# Patient Record
Sex: Male | Born: 1955 | Race: White | Hispanic: No | Marital: Married
Health system: Midwestern US, Academic
[De-identification: ages and names within clinical notes are randomized; demographics above are authoritative.]

---

## 2016-12-08 MED ORDER — METOPROLOL SUCCINATE 100 MG PO TB24
ORAL_TABLET | Freq: Every day | ORAL | 3 refills | 90.00000 days | Status: DC
Start: 2016-12-08 — End: 2017-11-30

## 2016-12-08 MED ORDER — ALPRAZOLAM 0.5 MG PO TAB
ORAL_TABLET | Freq: Three times a day (TID) | 5 refills | Status: DC | PRN
Start: 2016-12-08 — End: 2017-06-29

## 2017-01-18 ENCOUNTER — Encounter: Admit: 2017-01-18 | Discharge: 2017-01-18 | Payer: BC Managed Care – HMO

## 2017-01-18 MED ORDER — PRAVASTATIN 40 MG PO TAB
40 mg | ORAL_TABLET | Freq: Every evening | ORAL | 3 refills | 90.00000 days | Status: AC
Start: 2017-01-18 — End: 2017-11-30

## 2017-01-22 ENCOUNTER — Encounter: Admit: 2017-01-22 | Discharge: 2017-01-22 | Payer: BC Managed Care – HMO

## 2017-01-22 DIAGNOSIS — I513 Intracardiac thrombosis, not elsewhere classified: ICD-10-CM

## 2017-01-22 DIAGNOSIS — I4819 Other persistent atrial fibrillation: ICD-10-CM

## 2017-01-22 DIAGNOSIS — E7800 Pure hypercholesterolemia, unspecified: Principal | ICD-10-CM

## 2017-01-22 DIAGNOSIS — I10 Essential (primary) hypertension: ICD-10-CM

## 2017-01-22 DIAGNOSIS — G4734 Idiopathic sleep related nonobstructive alveolar hypoventilation: ICD-10-CM

## 2017-01-22 DIAGNOSIS — J449 Chronic obstructive pulmonary disease, unspecified: ICD-10-CM

## 2017-01-22 DIAGNOSIS — I5022 Chronic systolic (congestive) heart failure: ICD-10-CM

## 2017-01-22 DIAGNOSIS — K219 Gastro-esophageal reflux disease without esophagitis: ICD-10-CM

## 2017-01-22 MED ORDER — DOFETILIDE 500 MCG PO CAP
ORAL_CAPSULE | Freq: Two times a day (BID) | 3 refills | Status: AC
Start: 2017-01-22 — End: 2017-11-30

## 2017-04-08 ENCOUNTER — Encounter: Admit: 2017-04-08 | Discharge: 2017-04-08 | Payer: BC Managed Care – HMO

## 2017-04-22 ENCOUNTER — Encounter: Admit: 2017-04-22 | Discharge: 2017-04-22 | Payer: BC Managed Care – HMO

## 2017-04-22 ENCOUNTER — Ambulatory Visit: Admit: 2017-04-22 | Discharge: 2017-04-23 | Payer: BC Managed Care – HMO

## 2017-04-22 DIAGNOSIS — I426 Alcoholic cardiomyopathy: ICD-10-CM

## 2017-04-22 DIAGNOSIS — I5022 Chronic systolic (congestive) heart failure: ICD-10-CM

## 2017-04-22 DIAGNOSIS — E7800 Pure hypercholesterolemia, unspecified: ICD-10-CM

## 2017-04-22 DIAGNOSIS — I10 Essential (primary) hypertension: Principal | ICD-10-CM

## 2017-04-22 DIAGNOSIS — A0472 Enterocolitis due to Clostridium difficile, not specified as recurrent: ICD-10-CM

## 2017-04-22 DIAGNOSIS — M549 Dorsalgia, unspecified: ICD-10-CM

## 2017-04-22 DIAGNOSIS — F419 Anxiety disorder, unspecified: ICD-10-CM

## 2017-04-22 DIAGNOSIS — I4891 Unspecified atrial fibrillation: ICD-10-CM

## 2017-04-22 DIAGNOSIS — J449 Chronic obstructive pulmonary disease, unspecified: ICD-10-CM

## 2017-04-22 DIAGNOSIS — R198 Other specified symptoms and signs involving the digestive system and abdomen: ICD-10-CM

## 2017-04-22 DIAGNOSIS — I2510 Atherosclerotic heart disease of native coronary artery without angina pectoris: ICD-10-CM

## 2017-04-22 DIAGNOSIS — I82409 Acute embolism and thrombosis of unspecified deep veins of unspecified lower extremity: ICD-10-CM

## 2017-04-22 DIAGNOSIS — R002 Palpitations: ICD-10-CM

## 2017-04-22 DIAGNOSIS — R9439 Abnormal result of other cardiovascular function study: Principal | ICD-10-CM

## 2017-04-22 DIAGNOSIS — I480 Paroxysmal atrial fibrillation: ICD-10-CM

## 2017-04-22 DIAGNOSIS — E785 Hyperlipidemia, unspecified: ICD-10-CM

## 2017-04-22 DIAGNOSIS — R238 Other skin changes: ICD-10-CM

## 2017-04-22 MED ORDER — SPIRONOLACTONE 25 MG PO TAB
25 mg | ORAL_TABLET | Freq: Every day | ORAL | 3 refills | 90.00000 days | Status: AC
Start: 2017-04-22 — End: 2018-04-08

## 2017-04-22 NOTE — Telephone Encounter
-----   Message from Vanice SarahSteven D Owens, MD sent at 04/22/2017 11:05 AM CDT -----  Can you please call him to ask him to increase his spironolactone to 25 mg/day?  I hadn't noticed his diastolic BP readings while he was here, but we should probably try to pull his diastolic numbers down below 80 if possible.  Thanks!

## 2017-04-22 NOTE — Assessment & Plan Note
He has not had any symptoms suggesting recurrence of atrial arrhythmias.  Today's QT interval looks normal on Tikosyn.

## 2017-04-22 NOTE — Assessment & Plan Note
Lab Results   Component Value Date    CHOL 136 07/24/2016    TRIG 58 07/24/2016    HDL 36 (L) 07/24/2016    LDL 80 07/24/2016    VLDL 12 07/24/2016    NONHDLCHOL 100 07/24/2016    CHOLHDLC 4 11/23/2015      LDL looks good on current medical therapy.

## 2017-04-22 NOTE — Telephone Encounter
lmom requested call back if question

## 2017-04-22 NOTE — Telephone Encounter
04/22/2017 2:52 PM   Pt c/b and confirmed increasing to 25mg  aldactone daily. Pt will start tomorrow.

## 2017-04-22 NOTE — Assessment & Plan Note
He is not abstaining entirely but he has reduced his alcohol consumption significantly.

## 2017-04-22 NOTE — Progress Notes
Date of Service: 04/22/2017    Jonathan Barr is a 61 y.o. male.       Jonathan Barr     Jonathan Barr was in the office today for follow-up regarding atrial fibrillation and history of cardiomyopathy.  He's retired now, but isn't pursuing disability.    He has some dyspnea that he attributes to COPD.  He isn't having palpitations or light headedness.  He denies any anginal chest discomfort.    He drinks a little bit of beer at times but he is substantially decreased his alcohol intake.  When we last assessed his ventricular function in December his ejection fraction was 55%.    He does not think he has had any further problems with atrial fibrillation.  He denies any palpitations and he does check his pulse occasionally and has not noticed any particular irregularities.           Vitals:    04/22/17 1025 04/22/17 1036   BP: 124/82 126/90   Pulse: 61    Weight: 94.8 kg (209 lb)    Height: 1.854 m (6' 1)      Body mass index is 27.57 kg/m???.     Past Medical History  Patient Active Problem List    Diagnosis Date Noted   ??? 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 Umbilical hernia 03/30/2016     Added automatically from request for surgery [id number]     ??? Paroxysmal atrial fibrillation (HCC) 12/20/2015     02/22/01 Holter:  Predominant SR, Rare VPD's with bigeminy.  Rare non-sustained SVT.  Rare APD's    12/20/15 TEE with evidence of LA thrombus. Canceled DCCV. Digoxin loaded.  12/2015 - Successful TEE-cardioversion.  Started on Tikosyn.     ??? Pulmonary emphysema (HCC) 12/18/2015   ??? Nocturnal oxygen desaturation - O2 use 12/18/2015   ??? Colon polyp 02/07/2013     12/2012 - Colonoscopy     ??? GERD (gastroesophageal reflux disease) 02/07/2013     12/2012- EGD negative for Barrett's     ??? Pulmonary nodule 02/07/2013     12/2012 - CT-chest 4.7 mm nodular opacity of peripheral left lower lobe.  F/U CT recommended in 6 months.     ??? Essential hypertension 04/24/2011   ??? History of tobacco use 04/24/2011 ??? Cardiomyopathy, alcoholic (HCC)      EF  48%  per stress echo 02/29/00.    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  Normal study.           ??? Anxiety    ??? Chest pain 09/24/2006        a. Stress Echo 02/29/00 with mild resting global LV asyneresis. Induced inf and post wall myocardial ischemia      b. 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      c.  Cardiac cath 2/08 - Normal coronaries, normal LV function       ??? Hyperlipidemia          Review of Systems   Constitution: Negative.   HENT: Positive for hoarse voice and tinnitus.    Eyes: Negative.    Cardiovascular: Positive for chest pain.   Respiratory: Positive for shortness of breath, sputum production and wheezing.    Endocrine: Negative.    Hematologic/Lymphatic: Negative.    Skin: Positive for poor wound healing.  Musculoskeletal: Negative.    Gastrointestinal: Positive for diarrhea.   Genitourinary: Negative.    Neurological: Positive for headaches.   Psychiatric/Behavioral: Negative.    Allergic/Immunologic: Negative.        Physical Exam    Physical Exam   General Appearance: no distress   Skin: warm, no ulcers or xanthomas   Digits and Nails: no cyanosis or clubbing   Eyes: conjunctivae and lids normal, pupils are equal and round   Teeth/Gums/Palate: dentition unremarkable, no lesions   Lips & Oral Mucosa: no pallor or cyanosis   Neck Veins: normal JVP , neck veins are not distended   Thyroid: no nodules, masses, tenderness or enlargement   Chest Inspection: chest is normal in appearance   Respiratory Effort: breathing comfortably, no respiratory distress   Auscultation/Percussion: lungs clear to auscultation, no rales or rhonchi, no wheezing   PMI: PMI not enlarged or displaced   Cardiac Rhythm: regular rhythm and normal rate   Cardiac Auscultation: S1, S2 normal, no rub, no gallop   Murmurs: no murmur Peripheral Circulation: normal peripheral circulation   Carotid Arteries: normal carotid upstroke bilaterally, no bruits   Radial Arteries: normal symmetric radial pulses   Abdominal Aorta: no abdominal aortic bruit   Pedal Pulses: normal symmetric pedal pulses   Lower Extremity Edema: no lower extremity edema   Abdominal Exam: soft, non-tender, no masses, bowel sounds normal   Liver & Spleen: no organomegaly   Gait & Station: walks without assistance   Muscle Strength: normal muscle tone   Orientation: oriented to time, place and person   Affect & Mood: appropriate and sustained affect   Language and Memory: patient responsive and seems to comprehend information   Neurologic Exam: neurological assessment grossly intact   Other: moves all extremities      Cardiovascular Studies    EKG:  Sinus rhythm, rate 61.  QT 420 msec.    Problems Addressed Today  Encounter Diagnoses   Name Primary?   ??? Essential hypertension    ??? Pure hypercholesterolemia    ??? Paroxysmal atrial fibrillation (HCC)    ??? Cardiomyopathy, alcoholic (HCC)        Assessment and Plan       Essential hypertension  I am going to ask him to go back to taking 25 mg of Spironolactone daily.  Home blood pressure readings tend to be in the 130s systolic and the low 90s diastolic.  My goal would be to pull his diastolic BP down below 80.    Hyperlipidemia  Lab Results   Component Value Date    CHOL 136 07/24/2016    TRIG 58 07/24/2016    HDL 36 (L) 07/24/2016    LDL 80 07/24/2016    VLDL 12 07/24/2016    NONHDLCHOL 100 07/24/2016    CHOLHDLC 4 11/23/2015      LDL looks good on current medical therapy.    Paroxysmal atrial fibrillation (HCC)  He has not had any symptoms suggesting recurrence of atrial arrhythmias.  Today's QT interval looks normal on Tikosyn.    Cardiomyopathy, alcoholic (HCC)  He is not abstaining entirely but he has reduced his alcohol consumption significantly.  The most recent echocardiogram was from last December and it showed that his ventricular function had returned to normal while maintaining sinus rhythm.      Current Medications (including today's revisions)  ??? acetaminophen SR(+) (TYLENOL) 650 mg tablet Take 650 mg by mouth every 6 hours as needed for Pain.   ???  albuterol (VENTOLIN HFA, PROAIR HFA, PROVENTIL HFA) 90 mcg/actuation inhaler Inhale 2 Puffs by mouth into the lungs every 6 hours as needed for Wheezing or Shortness of Breath. Shake well before use.   ??? ALPRAZolam (XANAX) 0.5 mg tablet TAKE ONE TABLET BY MOUTH THREE TIMES DAILY AS NEEDED   ??? aspirin/acetaminophen/caffeine(+) (EXCEDRIN MIGRAINE) 250/250/65 mg tab Take 1 tablet by mouth twice daily as needed.   ??? dofetilide (TIKOSYN) 500 mcg capsule TAKE ONE CAPSULE BY MOUTH TWICE DAILY   ??? ipratropium/albuterol (COMBIVENT RESPIMAT) 20-100 mcg/actuation mist inhaler Inhale 1 Puff by mouth into the lungs four times daily.   ??? L.ACID/L.CASEI/B.BIF/B.LON/FOS (PROBIOTIC BLEND PO) Take 1 tablet by mouth every 48 hours.   ??? Magnesium Oxide 500 mg tab Take 1 tablet by mouth twice daily.   ??? metoprolol XL (TOPROL XL) 100 mg extended release tablet TAKE ONE TABLET BY MOUTH ONCE DAILY   ??? omeprazole DR(+) (PRILOSEC) 20 mg capsule Take 20 mg by mouth daily.   ??? pravastatin (PRAVACHOL) 40 mg tablet Take 1 tablet by mouth at bedtime daily.   ??? rivaroxaban (XARELTO) 20 mg tablet Take 1 Tab by mouth daily with dinner. Take with food.   ??? spironolactone (ALDACTONE) 25 mg tablet Take 0.5 tablets by mouth daily. Take with food.   ??? SYMBICORT 160-4.5 mcg/actuation HFAA inhalation INHALE TWO PUFFS BY MOUTH AS NEEDED (Patient taking differently: INHALE TWO PUFFS BY MOUTH TWO TIMES A DAY)

## 2017-06-28 ENCOUNTER — Encounter: Admit: 2017-06-28 | Discharge: 2017-06-28 | Payer: BC Managed Care – HMO

## 2017-06-28 DIAGNOSIS — R079 Chest pain, unspecified: Principal | ICD-10-CM

## 2017-06-28 DIAGNOSIS — F419 Anxiety disorder, unspecified: ICD-10-CM

## 2017-06-29 MED ORDER — ALPRAZOLAM 0.5 MG PO TAB
ORAL_TABLET | Freq: Three times a day (TID) | 5 refills | Status: AC | PRN
Start: 2017-06-29 — End: 2018-06-07

## 2017-10-21 ENCOUNTER — Encounter: Admit: 2017-10-21 | Discharge: 2017-10-21 | Payer: BC Managed Care – HMO

## 2017-10-21 MED ORDER — XARELTO 20 MG PO TAB
ORAL_TABLET | Freq: Every day | ORAL | 10 refills | 30.00000 days | Status: AC
Start: 2017-10-21 — End: 2018-09-09

## 2017-11-18 ENCOUNTER — Encounter: Admit: 2017-11-18 | Discharge: 2017-11-18 | Payer: BC Managed Care – HMO

## 2017-11-18 DIAGNOSIS — R238 Other skin changes: ICD-10-CM

## 2017-11-18 DIAGNOSIS — F419 Anxiety disorder, unspecified: ICD-10-CM

## 2017-11-18 DIAGNOSIS — M549 Dorsalgia, unspecified: ICD-10-CM

## 2017-11-18 DIAGNOSIS — I82409 Acute embolism and thrombosis of unspecified deep veins of unspecified lower extremity: ICD-10-CM

## 2017-11-18 DIAGNOSIS — I426 Alcoholic cardiomyopathy: ICD-10-CM

## 2017-11-18 DIAGNOSIS — A0472 Enterocolitis due to Clostridium difficile, not specified as recurrent: ICD-10-CM

## 2017-11-18 DIAGNOSIS — J449 Chronic obstructive pulmonary disease, unspecified: ICD-10-CM

## 2017-11-18 DIAGNOSIS — R198 Other specified symptoms and signs involving the digestive system and abdomen: ICD-10-CM

## 2017-11-18 DIAGNOSIS — I2510 Atherosclerotic heart disease of native coronary artery without angina pectoris: ICD-10-CM

## 2017-11-18 DIAGNOSIS — R002 Palpitations: ICD-10-CM

## 2017-11-18 DIAGNOSIS — E785 Hyperlipidemia, unspecified: ICD-10-CM

## 2017-11-18 DIAGNOSIS — I5022 Chronic systolic (congestive) heart failure: ICD-10-CM

## 2017-11-18 DIAGNOSIS — R9439 Abnormal result of other cardiovascular function study: Principal | ICD-10-CM

## 2017-11-18 DIAGNOSIS — I4891 Unspecified atrial fibrillation: ICD-10-CM

## 2017-11-19 LAB — COMPREHENSIVE METABOLIC PANEL
Lab: 102 — ABNORMAL HIGH (ref 65–99)
Lab: 14
Lab: 143
Lab: 23
Lab: 50 — ABNORMAL LOW (ref >60)
Lab: 58 — ABNORMAL LOW (ref >60)
Lab: 9.7

## 2017-11-19 LAB — CBC
Lab: 11
Lab: 13
Lab: 14 — ABNORMAL HIGH (ref 0.70–1.25)
Lab: 223
Lab: 29
Lab: 33
Lab: 8.2

## 2017-11-19 LAB — THYROID STIMULATING HORMONE-TSH: Lab: 1.9

## 2017-11-19 LAB — LIPID PROFILE
Lab: 100
Lab: 108 — ABNORMAL HIGH (ref <100)
Lab: 170

## 2017-11-30 ENCOUNTER — Ambulatory Visit: Admit: 2017-11-30 | Discharge: 2017-12-01 | Payer: BC Managed Care – HMO

## 2017-11-30 ENCOUNTER — Encounter: Admit: 2017-11-30 | Discharge: 2017-11-30 | Payer: BC Managed Care – HMO

## 2017-11-30 DIAGNOSIS — I5022 Chronic systolic (congestive) heart failure: ICD-10-CM

## 2017-11-30 DIAGNOSIS — I426 Alcoholic cardiomyopathy: ICD-10-CM

## 2017-11-30 DIAGNOSIS — I4891 Unspecified atrial fibrillation: ICD-10-CM

## 2017-11-30 DIAGNOSIS — I10 Essential (primary) hypertension: ICD-10-CM

## 2017-11-30 DIAGNOSIS — K219 Gastro-esophageal reflux disease without esophagitis: ICD-10-CM

## 2017-11-30 DIAGNOSIS — J449 Chronic obstructive pulmonary disease, unspecified: ICD-10-CM

## 2017-11-30 DIAGNOSIS — G4734 Idiopathic sleep related nonobstructive alveolar hypoventilation: ICD-10-CM

## 2017-11-30 DIAGNOSIS — R198 Other specified symptoms and signs involving the digestive system and abdomen: ICD-10-CM

## 2017-11-30 DIAGNOSIS — F419 Anxiety disorder, unspecified: ICD-10-CM

## 2017-11-30 DIAGNOSIS — I4819 Other persistent atrial fibrillation: ICD-10-CM

## 2017-11-30 DIAGNOSIS — M549 Dorsalgia, unspecified: ICD-10-CM

## 2017-11-30 DIAGNOSIS — I513 Intracardiac thrombosis, not elsewhere classified: ICD-10-CM

## 2017-11-30 DIAGNOSIS — A0472 Enterocolitis due to Clostridium difficile, not specified as recurrent: ICD-10-CM

## 2017-11-30 DIAGNOSIS — E7800 Pure hypercholesterolemia, unspecified: Principal | ICD-10-CM

## 2017-11-30 DIAGNOSIS — E785 Hyperlipidemia, unspecified: ICD-10-CM

## 2017-11-30 DIAGNOSIS — I2510 Atherosclerotic heart disease of native coronary artery without angina pectoris: ICD-10-CM

## 2017-11-30 DIAGNOSIS — R238 Other skin changes: ICD-10-CM

## 2017-11-30 DIAGNOSIS — I82409 Acute embolism and thrombosis of unspecified deep veins of unspecified lower extremity: ICD-10-CM

## 2017-11-30 DIAGNOSIS — R9439 Abnormal result of other cardiovascular function study: Principal | ICD-10-CM

## 2017-11-30 DIAGNOSIS — R002 Palpitations: ICD-10-CM

## 2017-11-30 DIAGNOSIS — I480 Paroxysmal atrial fibrillation: ICD-10-CM

## 2017-11-30 MED ORDER — DOFETILIDE 500 MCG PO CAP
500 ug | ORAL_CAPSULE | Freq: Two times a day (BID) | ORAL | 3 refills | Status: AC
Start: 2017-11-30 — End: 2018-03-09

## 2017-11-30 MED ORDER — METOPROLOL SUCCINATE 100 MG PO TB24
100 mg | ORAL_TABLET | Freq: Every day | ORAL | 3 refills | 90.00000 days | Status: AC
Start: 2017-11-30 — End: 2018-12-06

## 2017-11-30 MED ORDER — PRAVASTATIN 40 MG PO TAB
40 mg | ORAL_TABLET | Freq: Every evening | ORAL | 3 refills | 90.00000 days | Status: AC
Start: 2017-11-30 — End: 2018-12-06

## 2017-12-01 ENCOUNTER — Encounter: Admit: 2017-12-01 | Discharge: 2017-12-01 | Payer: BC Managed Care – HMO

## 2017-12-06 ENCOUNTER — Encounter: Admit: 2017-12-06 | Discharge: 2017-12-06 | Payer: BC Managed Care – HMO

## 2017-12-06 ENCOUNTER — Ambulatory Visit: Admit: 2017-12-06 | Discharge: 2017-12-07 | Payer: BC Managed Care – HMO

## 2017-12-06 DIAGNOSIS — I10 Essential (primary) hypertension: Principal | ICD-10-CM

## 2017-12-08 ENCOUNTER — Encounter: Admit: 2017-12-08 | Discharge: 2017-12-08 | Payer: BC Managed Care – HMO

## 2017-12-28 ENCOUNTER — Encounter: Admit: 2017-12-28 | Discharge: 2017-12-28 | Payer: BC Managed Care – HMO

## 2017-12-28 ENCOUNTER — Ambulatory Visit: Admit: 2017-12-28 | Discharge: 2017-12-29 | Payer: BC Managed Care – HMO

## 2017-12-28 ENCOUNTER — Ambulatory Visit: Admit: 2017-12-28 | Discharge: 2017-12-28 | Payer: BC Managed Care – HMO

## 2017-12-28 DIAGNOSIS — R109 Unspecified abdominal pain: ICD-10-CM

## 2017-12-28 DIAGNOSIS — M549 Dorsalgia, unspecified: ICD-10-CM

## 2017-12-28 DIAGNOSIS — E785 Hyperlipidemia, unspecified: ICD-10-CM

## 2017-12-28 DIAGNOSIS — J449 Chronic obstructive pulmonary disease, unspecified: ICD-10-CM

## 2017-12-28 DIAGNOSIS — R238 Other skin changes: ICD-10-CM

## 2017-12-28 DIAGNOSIS — I426 Alcoholic cardiomyopathy: ICD-10-CM

## 2017-12-28 DIAGNOSIS — F419 Anxiety disorder, unspecified: ICD-10-CM

## 2017-12-28 DIAGNOSIS — A0472 Enterocolitis due to Clostridium difficile, not specified as recurrent: ICD-10-CM

## 2017-12-28 DIAGNOSIS — J312 Chronic pharyngitis: Principal | ICD-10-CM

## 2017-12-28 DIAGNOSIS — R198 Other specified symptoms and signs involving the digestive system and abdomen: ICD-10-CM

## 2017-12-28 DIAGNOSIS — I4891 Unspecified atrial fibrillation: ICD-10-CM

## 2017-12-28 DIAGNOSIS — Z1211 Encounter for screening for malignant neoplasm of colon: ICD-10-CM

## 2017-12-28 DIAGNOSIS — Z125 Encounter for screening for malignant neoplasm of prostate: ICD-10-CM

## 2017-12-28 DIAGNOSIS — I82409 Acute embolism and thrombosis of unspecified deep veins of unspecified lower extremity: ICD-10-CM

## 2017-12-28 DIAGNOSIS — I2510 Atherosclerotic heart disease of native coronary artery without angina pectoris: ICD-10-CM

## 2017-12-28 DIAGNOSIS — I5022 Chronic systolic (congestive) heart failure: ICD-10-CM

## 2017-12-28 DIAGNOSIS — R002 Palpitations: ICD-10-CM

## 2017-12-28 DIAGNOSIS — R9439 Abnormal result of other cardiovascular function study: Principal | ICD-10-CM

## 2017-12-28 LAB — URINALYSIS MICROSCOPIC REFLEX TO CULTURE

## 2017-12-28 LAB — URINALYSIS DIPSTICK REFLEX TO CULTURE
Lab: 1 (ref 1.003–1.035)
Lab: 5 M/uL (ref 5.0–8.0)
Lab: NEGATIVE
Lab: NEGATIVE
Lab: NEGATIVE % (ref 11.5–17.0)
Lab: NEGATIVE fL (ref 80–99)
Lab: NEGATIVE g/dL (ref 33–37)
Lab: NEGATIVE pg — ABNORMAL HIGH (ref 27–31)
Lab: NEGATIVE
Lab: NEGATIVE

## 2017-12-29 LAB — PSA SCREEN: Lab: 0.2 ng/mL (ref <4.01)

## 2017-12-30 ENCOUNTER — Encounter: Admit: 2017-12-30 | Discharge: 2017-12-30 | Payer: BC Managed Care – HMO

## 2017-12-30 DIAGNOSIS — Z1211 Encounter for screening for malignant neoplasm of colon: Principal | ICD-10-CM

## 2018-01-19 ENCOUNTER — Ambulatory Visit: Admit: 2018-01-19 | Discharge: 2018-01-19 | Payer: BC Managed Care – HMO

## 2018-01-19 ENCOUNTER — Encounter: Admit: 2018-01-19 | Discharge: 2018-01-19 | Payer: BC Managed Care – HMO

## 2018-01-19 DIAGNOSIS — J040 Acute laryngitis: ICD-10-CM

## 2018-01-19 DIAGNOSIS — J449 Chronic obstructive pulmonary disease, unspecified: ICD-10-CM

## 2018-01-19 DIAGNOSIS — R490 Dysphonia: ICD-10-CM

## 2018-01-19 DIAGNOSIS — R002 Palpitations: ICD-10-CM

## 2018-01-19 DIAGNOSIS — M549 Dorsalgia, unspecified: ICD-10-CM

## 2018-01-19 DIAGNOSIS — B370 Candidal stomatitis: Principal | ICD-10-CM

## 2018-01-19 DIAGNOSIS — I5022 Chronic systolic (congestive) heart failure: ICD-10-CM

## 2018-01-19 DIAGNOSIS — Z87891 Personal history of nicotine dependence: ICD-10-CM

## 2018-01-19 DIAGNOSIS — R109 Unspecified abdominal pain: Principal | ICD-10-CM

## 2018-01-19 DIAGNOSIS — I426 Alcoholic cardiomyopathy: ICD-10-CM

## 2018-01-19 DIAGNOSIS — F419 Anxiety disorder, unspecified: ICD-10-CM

## 2018-01-19 DIAGNOSIS — R198 Other specified symptoms and signs involving the digestive system and abdomen: ICD-10-CM

## 2018-01-19 DIAGNOSIS — R238 Other skin changes: ICD-10-CM

## 2018-01-19 DIAGNOSIS — A0472 Enterocolitis due to Clostridium difficile, not specified as recurrent: ICD-10-CM

## 2018-01-19 DIAGNOSIS — I4891 Unspecified atrial fibrillation: ICD-10-CM

## 2018-01-19 DIAGNOSIS — I82409 Acute embolism and thrombosis of unspecified deep veins of unspecified lower extremity: ICD-10-CM

## 2018-01-19 DIAGNOSIS — E785 Hyperlipidemia, unspecified: ICD-10-CM

## 2018-01-19 DIAGNOSIS — I2510 Atherosclerotic heart disease of native coronary artery without angina pectoris: ICD-10-CM

## 2018-01-19 DIAGNOSIS — J312 Chronic pharyngitis: ICD-10-CM

## 2018-01-19 DIAGNOSIS — R9439 Abnormal result of other cardiovascular function study: Principal | ICD-10-CM

## 2018-01-20 ENCOUNTER — Encounter: Admit: 2018-01-20 | Discharge: 2018-01-20 | Payer: BC Managed Care – HMO

## 2018-01-20 DIAGNOSIS — K769 Liver disease, unspecified: Principal | ICD-10-CM

## 2018-01-25 ENCOUNTER — Encounter: Admit: 2018-01-25 | Discharge: 2018-01-25 | Payer: BC Managed Care – HMO

## 2018-02-14 ENCOUNTER — Ambulatory Visit: Admit: 2018-02-14 | Discharge: 2018-02-14 | Payer: BC Managed Care – HMO

## 2018-02-14 DIAGNOSIS — K769 Liver disease, unspecified: Principal | ICD-10-CM

## 2018-02-14 LAB — POC CREATININE, RAD: Lab: 1.3 mg/dL — ABNORMAL HIGH (ref 0.4–1.24)

## 2018-02-14 MED ORDER — SODIUM CHLORIDE 0.9 % IJ SOLN
50 mL | Freq: Once | INTRAVENOUS | 0 refills | Status: CP
Start: 2018-02-14 — End: ?
  Administered 2018-02-14: 20:00:00 50 mL via INTRAVENOUS

## 2018-02-14 MED ORDER — IOHEXOL 350 MG IODINE/ML IV SOLN
80 mL | Freq: Once | INTRAVENOUS | 0 refills | Status: CP
Start: 2018-02-14 — End: ?
  Administered 2018-02-14: 20:00:00 80 mL via INTRAVENOUS

## 2018-03-08 MED ORDER — PEG-ELECTROLYTE SOLN 420 GRAM PO SOLR
4 L | Freq: Once | ORAL | 0 refills | Status: AC
Start: 2018-03-08 — End: ?

## 2018-03-09 ENCOUNTER — Encounter: Admit: 2018-03-09 | Discharge: 2018-03-09 | Payer: BC Managed Care – HMO

## 2018-03-09 DIAGNOSIS — I513 Intracardiac thrombosis, not elsewhere classified: ICD-10-CM

## 2018-03-09 DIAGNOSIS — J449 Chronic obstructive pulmonary disease, unspecified: ICD-10-CM

## 2018-03-09 DIAGNOSIS — G4734 Idiopathic sleep related nonobstructive alveolar hypoventilation: ICD-10-CM

## 2018-03-09 DIAGNOSIS — I10 Essential (primary) hypertension: ICD-10-CM

## 2018-03-09 DIAGNOSIS — K219 Gastro-esophageal reflux disease without esophagitis: ICD-10-CM

## 2018-03-09 DIAGNOSIS — I4819 Other persistent atrial fibrillation: ICD-10-CM

## 2018-03-09 DIAGNOSIS — I5022 Chronic systolic (congestive) heart failure: ICD-10-CM

## 2018-03-09 DIAGNOSIS — E7800 Pure hypercholesterolemia, unspecified: Principal | ICD-10-CM

## 2018-03-09 MED ORDER — DOFETILIDE 500 MCG PO CAP
ORAL_CAPSULE | Freq: Two times a day (BID) | 1 refills | Status: AC
Start: 2018-03-09 — End: 2018-10-06

## 2018-03-11 ENCOUNTER — Encounter: Admit: 2018-03-11 | Discharge: 2018-03-11 | Payer: BC Managed Care – HMO

## 2018-03-23 ENCOUNTER — Ambulatory Visit: Admit: 2018-03-23 | Discharge: 2018-03-23 | Payer: BC Managed Care – HMO

## 2018-03-23 ENCOUNTER — Encounter: Admit: 2018-03-23 | Discharge: 2018-03-23 | Payer: BC Managed Care – HMO

## 2018-03-23 DIAGNOSIS — Z885 Allergy status to narcotic agent status: ICD-10-CM

## 2018-03-23 DIAGNOSIS — I4891 Unspecified atrial fibrillation: ICD-10-CM

## 2018-03-23 DIAGNOSIS — Z87891 Personal history of nicotine dependence: ICD-10-CM

## 2018-03-23 DIAGNOSIS — A0472 Enterocolitis due to Clostridium difficile, not specified as recurrent: ICD-10-CM

## 2018-03-23 DIAGNOSIS — Z886 Allergy status to analgesic agent status: ICD-10-CM

## 2018-03-23 DIAGNOSIS — R002 Palpitations: ICD-10-CM

## 2018-03-23 DIAGNOSIS — E785 Hyperlipidemia, unspecified: ICD-10-CM

## 2018-03-23 DIAGNOSIS — Z86718 Personal history of other venous thrombosis and embolism: ICD-10-CM

## 2018-03-23 DIAGNOSIS — M549 Dorsalgia, unspecified: ICD-10-CM

## 2018-03-23 DIAGNOSIS — Z7901 Long term (current) use of anticoagulants: ICD-10-CM

## 2018-03-23 DIAGNOSIS — J449 Chronic obstructive pulmonary disease, unspecified: ICD-10-CM

## 2018-03-23 DIAGNOSIS — F419 Anxiety disorder, unspecified: ICD-10-CM

## 2018-03-23 DIAGNOSIS — I5022 Chronic systolic (congestive) heart failure: ICD-10-CM

## 2018-03-23 DIAGNOSIS — Z86010 Personal history of colonic polyps: ICD-10-CM

## 2018-03-23 DIAGNOSIS — R9439 Abnormal result of other cardiovascular function study: Principal | ICD-10-CM

## 2018-03-23 DIAGNOSIS — Z1211 Encounter for screening for malignant neoplasm of colon: Principal | ICD-10-CM

## 2018-03-23 DIAGNOSIS — R198 Other specified symptoms and signs involving the digestive system and abdomen: ICD-10-CM

## 2018-03-23 DIAGNOSIS — I82409 Acute embolism and thrombosis of unspecified deep veins of unspecified lower extremity: ICD-10-CM

## 2018-03-23 DIAGNOSIS — I426 Alcoholic cardiomyopathy: ICD-10-CM

## 2018-03-23 DIAGNOSIS — I2510 Atherosclerotic heart disease of native coronary artery without angina pectoris: ICD-10-CM

## 2018-03-23 DIAGNOSIS — R238 Other skin changes: ICD-10-CM

## 2018-03-23 MED ORDER — PROPOFOL 10 MG/ML IV EMUL 50 ML (INFUSION)(AM)(OR)
INTRAVENOUS | 0 refills | Status: DC
Start: 2018-03-23 — End: 2018-03-23
  Administered 2018-03-23: 14:00:00 120 ug/kg/min via INTRAVENOUS
  Administered 2018-03-23: 14:00:00 60000 ug via INTRAVENOUS

## 2018-03-23 MED ORDER — LACTATED RINGERS IV SOLP
1000 mL | INTRAVENOUS | 0 refills | Status: DC
Start: 2018-03-23 — End: 2018-03-23
  Administered 2018-03-23: 14:00:00 1000.000 mL via INTRAVENOUS
  Administered 2018-03-23: 13:00:00 1000 mL via INTRAVENOUS

## 2018-03-23 MED ORDER — LIDOCAINE (PF) 200 MG/10 ML (2 %) IJ SYRG
0 refills | Status: DC
Start: 2018-03-23 — End: 2018-03-23
  Administered 2018-03-23: 15:00:00 100 mg via INTRAVENOUS

## 2018-03-24 ENCOUNTER — Encounter: Admit: 2018-03-24 | Discharge: 2018-03-24 | Payer: BC Managed Care – HMO

## 2018-03-24 DIAGNOSIS — R238 Other skin changes: ICD-10-CM

## 2018-03-24 DIAGNOSIS — I2510 Atherosclerotic heart disease of native coronary artery without angina pectoris: ICD-10-CM

## 2018-03-24 DIAGNOSIS — M549 Dorsalgia, unspecified: ICD-10-CM

## 2018-03-24 DIAGNOSIS — J449 Chronic obstructive pulmonary disease, unspecified: ICD-10-CM

## 2018-03-24 DIAGNOSIS — I426 Alcoholic cardiomyopathy: ICD-10-CM

## 2018-03-24 DIAGNOSIS — I4891 Unspecified atrial fibrillation: ICD-10-CM

## 2018-03-24 DIAGNOSIS — I82409 Acute embolism and thrombosis of unspecified deep veins of unspecified lower extremity: ICD-10-CM

## 2018-03-24 DIAGNOSIS — F419 Anxiety disorder, unspecified: ICD-10-CM

## 2018-03-24 DIAGNOSIS — R198 Other specified symptoms and signs involving the digestive system and abdomen: ICD-10-CM

## 2018-03-24 DIAGNOSIS — E785 Hyperlipidemia, unspecified: ICD-10-CM

## 2018-03-24 DIAGNOSIS — R9439 Abnormal result of other cardiovascular function study: Principal | ICD-10-CM

## 2018-03-24 DIAGNOSIS — I5022 Chronic systolic (congestive) heart failure: ICD-10-CM

## 2018-03-24 DIAGNOSIS — A0472 Enterocolitis due to Clostridium difficile, not specified as recurrent: ICD-10-CM

## 2018-03-24 DIAGNOSIS — R002 Palpitations: ICD-10-CM

## 2018-04-08 ENCOUNTER — Encounter: Admit: 2018-04-08 | Discharge: 2018-04-08 | Payer: BC Managed Care – HMO

## 2018-04-08 MED ORDER — SPIRONOLACTONE 25 MG PO TAB
ORAL_TABLET | Freq: Every day | ORAL | 3 refills | 90.00000 days | Status: AC
Start: 2018-04-08 — End: 2018-06-23

## 2018-05-31 ENCOUNTER — Encounter: Admit: 2018-05-31 | Discharge: 2018-05-31 | Payer: BC Managed Care – HMO

## 2018-05-31 ENCOUNTER — Emergency Department
Admit: 2018-05-31 | Discharge: 2018-05-31 | Payer: BC Managed Care – HMO | Attending: Student in an Organized Health Care Education/Training Program

## 2018-05-31 DIAGNOSIS — L03116 Cellulitis of left lower limb: ICD-10-CM

## 2018-05-31 LAB — COMPREHENSIVE METABOLIC PANEL
Lab: 12 U/L (ref 7–56)
Lab: 140 MMOL/L (ref 137–147)
Lab: 15 U/L (ref 7–40)
Lab: 26 MMOL/L (ref 21–30)
Lab: 4.1 g/dL (ref 3.5–5.0)
Lab: 6.8 g/dL (ref 6.0–8.0)
Lab: 64 U/L — ABNORMAL LOW (ref 25–110)
Lab: 89 mg/dL (ref 70–100)
Lab: 9.5 mg/dL (ref 8.5–10.6)
Lab: >60 mL/min — ABNORMAL HIGH (ref >60)

## 2018-05-31 LAB — CBC AND DIFF
Lab: 0 10*3/uL (ref 0–0.20)
Lab: 0 10*3/uL (ref 0–0.45)
Lab: 13 10*3/uL — ABNORMAL HIGH (ref 4.5–11.0)
Lab: 2 K/UL — ABNORMAL LOW (ref >60)
Lab: 9.3 FL (ref 7–11)
Lab: 9.7 K/UL — ABNORMAL HIGH (ref 1.8–7.0)

## 2018-05-31 LAB — C REACTIVE PROTEIN (CRP): Lab: 0 mg/dL (ref <1.0)

## 2018-05-31 LAB — PTT (APTT): Lab: 36 s — ABNORMAL HIGH (ref 24.0–36.5)

## 2018-05-31 LAB — MAGNESIUM: Lab: 2.3 mg/dL — AB (ref 1.6–2.6)

## 2018-05-31 LAB — SED RATE: Lab: 7 mm/h (ref 0–20)

## 2018-05-31 LAB — PROTIME INR (PT): Lab: 1.3 mg/dL — ABNORMAL HIGH (ref 0.8–1.2)

## 2018-05-31 MED ORDER — MORPHINE 2 MG/ML IV CRTG
4 mg | Freq: Once | INTRAVENOUS | 0 refills | Status: CP
Start: 2018-05-31 — End: ?
  Administered 2018-05-31: 21:00:00 4 mg via INTRAVENOUS

## 2018-05-31 MED ORDER — SODIUM CHLORIDE 0.9 % IV SOLP
1000 mL | INTRAVENOUS | 0 refills | Status: CP
Start: 2018-05-31 — End: ?
  Administered 2018-05-31: 21:00:00 1000 mL via INTRAVENOUS

## 2018-05-31 MED ORDER — PRAVASTATIN 40 MG PO TAB
40 mg | Freq: Every evening | ORAL | 0 refills | Status: DC
Start: 2018-05-31 — End: 2018-06-04
  Administered 2018-06-01 – 2018-06-04 (×4): 40 mg via ORAL

## 2018-05-31 MED ORDER — ALPRAZOLAM 0.5 MG PO TAB
.5 mg | Freq: Every evening | ORAL | 0 refills | Status: DC | PRN
Start: 2018-05-31 — End: 2018-06-04
  Administered 2018-06-01 – 2018-06-04 (×4): 0.5 mg via ORAL

## 2018-05-31 MED ORDER — OXYCODONE 5 MG PO TAB
5-15 mg | ORAL | 0 refills | Status: DC | PRN
Start: 2018-05-31 — End: 2018-06-01

## 2018-05-31 MED ORDER — DOFETILIDE 500 MCG PO CAP
500 ug | Freq: Two times a day (BID) | ORAL | 0 refills | Status: DC
Start: 2018-05-31 — End: 2018-06-04
  Administered 2018-06-01 – 2018-06-04 (×8): 500 ug via ORAL

## 2018-05-31 MED ORDER — SPIRONOLACTONE 25 MG PO TAB
25 mg | Freq: Every day | ORAL | 0 refills | Status: DC
Start: 2018-05-31 — End: 2018-06-04
  Administered 2018-06-01 – 2018-06-04 (×3): 25 mg via ORAL

## 2018-05-31 MED ORDER — SPIRONOLACTONE 25 MG PO TAB
25 mg | Freq: Every day | ORAL | 0 refills | Status: DC
Start: 2018-05-31 — End: 2018-05-31

## 2018-05-31 MED ORDER — VANCOMYCIN 1,500 MG IVPB
15 mg/kg | Freq: Once | INTRAVENOUS | 0 refills | Status: CP
Start: 2018-05-31 — End: ?
  Administered 2018-05-31 (×2): 1500 mg via INTRAVENOUS

## 2018-05-31 MED ORDER — RIVAROXABAN 20 MG PO TAB
20 mg | Freq: Every day | ORAL | 0 refills | Status: DC
Start: 2018-05-31 — End: 2018-06-04
  Administered 2018-06-01 – 2018-06-03 (×4): 20 mg via ORAL

## 2018-05-31 MED ORDER — LIDOCAINE-EPINEPHRINE 1 %-1:100,000 IJ SOLN
30 mL | Freq: Once | INTRAMUSCULAR | 0 refills | Status: CP
Start: 2018-05-31 — End: ?
  Administered 2018-05-31: 20:00:00 30 mL via INTRAMUSCULAR

## 2018-05-31 MED ORDER — MORPHINE 2 MG/ML IV CRTG
4 mg | Freq: Once | INTRAMUSCULAR | 0 refills | Status: CP
Start: 2018-05-31 — End: ?
  Administered 2018-05-31: 20:00:00 4 mg via INTRAMUSCULAR

## 2018-05-31 MED ORDER — ACETAMINOPHEN 325 MG PO TAB
650 mg | Freq: Once | ORAL | 0 refills | Status: CP
Start: 2018-05-31 — End: ?
  Administered 2018-05-31: 18:00:00 650 mg via ORAL

## 2018-05-31 MED ORDER — METOPROLOL SUCCINATE 100 MG PO TB24
100 mg | Freq: Every day | ORAL | 0 refills | Status: DC
Start: 2018-05-31 — End: 2018-06-04
  Administered 2018-06-01 – 2018-06-04 (×4): 100 mg via ORAL

## 2018-05-31 MED ORDER — ACETAMINOPHEN 500 MG PO TAB
1000 mg | ORAL | 0 refills | Status: DC | PRN
Start: 2018-05-31 — End: 2018-06-04
  Administered 2018-06-01 – 2018-06-04 (×6): 1000 mg via ORAL

## 2018-05-31 MED ORDER — MORPHINE 15 MG PO TAB
15 mg | ORAL | 0 refills | Status: DC | PRN
Start: 2018-05-31 — End: 2018-06-02
  Administered 2018-06-01 – 2018-06-02 (×5): 15 mg via ORAL

## 2018-06-01 ENCOUNTER — Encounter: Admit: 2018-06-01 | Discharge: 2018-06-01 | Payer: BC Managed Care – HMO

## 2018-06-01 LAB — CBC AND DIFF
Lab: 40 % — ABNORMAL HIGH (ref 40–50)
Lab: 9.7 K/UL — ABNORMAL LOW (ref 4.5–11.0)

## 2018-06-01 LAB — GRAM STAIN

## 2018-06-01 LAB — CELL COUNT W/DIFF-SYN FLUID: Lab: 7 %

## 2018-06-01 LAB — COMPREHENSIVE METABOLIC PANEL: Lab: 139 MMOL/L — ABNORMAL HIGH (ref 137–147)

## 2018-06-01 MED ORDER — PANTOPRAZOLE 40 MG PO TBEC
40 mg | Freq: Every day | ORAL | 0 refills | Status: DC
Start: 2018-06-01 — End: 2018-06-04
  Administered 2018-06-01 – 2018-06-04 (×4): 40 mg via ORAL

## 2018-06-01 MED ORDER — IPRATROPIUM-ALBUTEROL 20-100 MCG/ACTUATION IN MIST
1 | Freq: Four times a day (QID) | RESPIRATORY_TRACT | 0 refills | Status: DC
Start: 2018-06-01 — End: 2018-06-04
  Administered 2018-06-01: 18:00:00 1 via RESPIRATORY_TRACT

## 2018-06-01 MED ORDER — ALBUTEROL SULFATE 90 MCG/ACTUATION IN HFAA
2 | RESPIRATORY_TRACT | 0 refills | Status: DC | PRN
Start: 2018-06-01 — End: 2018-06-04

## 2018-06-01 MED ORDER — BUDESONIDE-FORMOTEROL 160-4.5 MCG/ACTUATION IN HFAA
2 | Freq: Two times a day (BID) | RESPIRATORY_TRACT | 0 refills | Status: DC
Start: 2018-06-01 — End: 2018-06-04
  Administered 2018-06-01: 18:00:00 2 via RESPIRATORY_TRACT

## 2018-06-01 MED ORDER — ALBUTEROL SULFATE 2.5 MG /3 ML (0.083 %) IN NEBU
2.5 mg | RESPIRATORY_TRACT | 0 refills | Status: DC | PRN
Start: 2018-06-01 — End: 2018-06-04

## 2018-06-01 MED ADMIN — ALBUTEROL SULFATE 2.5 MG /3 ML (0.083 %) IN NEBU [250]: 2.5 mg | RESPIRATORY_TRACT | @ 15:00:00 | Stop: 2018-06-01 | NDC 00487950101

## 2018-06-02 LAB — COMPREHENSIVE METABOLIC PANEL: Lab: 138 MMOL/L — ABNORMAL LOW (ref >60)

## 2018-06-02 LAB — MAGNESIUM: Lab: 1.8 mg/dL — ABNORMAL HIGH (ref 1.6–2.6)

## 2018-06-02 LAB — CBC AND DIFF: Lab: 8.5 K/UL — ABNORMAL LOW (ref 4.5–11.0)

## 2018-06-02 MED ORDER — MAGNESIUM OXIDE 400 MG (241.3 MG MAGNESIUM) PO TAB
400 mg | Freq: Two times a day (BID) | ORAL | 0 refills | Status: DC
Start: 2018-06-02 — End: 2018-06-04
  Administered 2018-06-02 – 2018-06-04 (×5): 400 mg via ORAL

## 2018-06-02 MED ORDER — PROCHLORPERAZINE EDISYLATE 5 MG/ML IJ SOLN
10 mg | Freq: Once | INTRAVENOUS | 0 refills | Status: AC
Start: 2018-06-02 — End: ?

## 2018-06-02 MED ORDER — ALUM-MAG HYDROXIDE-SIMETH 200-200-20 MG/5 ML PO SUSP
30 mL | ORAL | 0 refills | Status: DC | PRN
Start: 2018-06-02 — End: 2018-06-04
  Administered 2018-06-02: 23:00:00 30 mL via ORAL

## 2018-06-03 ENCOUNTER — Encounter: Admit: 2018-06-03 | Discharge: 2018-06-03 | Payer: BC Managed Care – HMO

## 2018-06-03 LAB — COMPREHENSIVE METABOLIC PANEL
Lab: 102 MMOL/L — ABNORMAL LOW (ref 98–110)
Lab: 136 MMOL/L — ABNORMAL LOW (ref 137–147)
Lab: 22 mg/dL — ABNORMAL HIGH (ref 7–25)

## 2018-06-03 LAB — CBC AND DIFF: Lab: 14 g/dL — ABNORMAL HIGH (ref 13.5–16.5)

## 2018-06-04 ENCOUNTER — Emergency Department
Admit: 2018-05-31 | Discharge: 2018-05-31 | Payer: BC Managed Care – HMO | Attending: Student in an Organized Health Care Education/Training Program

## 2018-06-04 ENCOUNTER — Encounter: Admit: 2018-06-04 | Discharge: 2018-06-04 | Payer: BC Managed Care – HMO

## 2018-06-04 ENCOUNTER — Observation Stay: Admit: 2018-05-31 | Discharge: 2018-06-04 | Payer: BC Managed Care – HMO

## 2018-06-04 DIAGNOSIS — Z886 Allergy status to analgesic agent status: ICD-10-CM

## 2018-06-04 DIAGNOSIS — Z86711 Personal history of pulmonary embolism: ICD-10-CM

## 2018-06-04 DIAGNOSIS — G8929 Other chronic pain: ICD-10-CM

## 2018-06-04 DIAGNOSIS — I5022 Chronic systolic (congestive) heart failure: ICD-10-CM

## 2018-06-04 DIAGNOSIS — I110 Hypertensive heart disease with heart failure: ICD-10-CM

## 2018-06-04 DIAGNOSIS — S8002XA Contusion of left knee, initial encounter: Principal | ICD-10-CM

## 2018-06-04 DIAGNOSIS — E785 Hyperlipidemia, unspecified: ICD-10-CM

## 2018-06-04 DIAGNOSIS — Z0489 Encounter for examination and observation for other specified reasons: ICD-10-CM

## 2018-06-04 DIAGNOSIS — J449 Chronic obstructive pulmonary disease, unspecified: ICD-10-CM

## 2018-06-04 DIAGNOSIS — K219 Gastro-esophageal reflux disease without esophagitis: ICD-10-CM

## 2018-06-04 DIAGNOSIS — I480 Paroxysmal atrial fibrillation: ICD-10-CM

## 2018-06-04 DIAGNOSIS — Z87891 Personal history of nicotine dependence: ICD-10-CM

## 2018-06-04 DIAGNOSIS — Z7901 Long term (current) use of anticoagulants: ICD-10-CM

## 2018-06-04 DIAGNOSIS — Z86718 Personal history of other venous thrombosis and embolism: ICD-10-CM

## 2018-06-04 DIAGNOSIS — M25461 Effusion, right knee: ICD-10-CM

## 2018-06-04 DIAGNOSIS — Z79899 Other long term (current) drug therapy: ICD-10-CM

## 2018-06-04 DIAGNOSIS — I426 Alcoholic cardiomyopathy: ICD-10-CM

## 2018-06-04 DIAGNOSIS — Z885 Allergy status to narcotic agent status: ICD-10-CM

## 2018-06-04 LAB — COMPREHENSIVE METABOLIC PANEL
Lab: 0.6 mg/dL — ABNORMAL HIGH (ref 0.3–1.2)
Lab: 1.4 mg/dL — ABNORMAL HIGH (ref 0.4–1.24)
Lab: 138 MMOL/L — ABNORMAL HIGH (ref >60)

## 2018-06-04 LAB — CBC AND DIFF
Lab: 30 pg — ABNORMAL LOW (ref 26–34)
Lab: 4.7 M/UL — ABNORMAL LOW (ref >60)
Lab: 8.5 K/UL — ABNORMAL LOW (ref >60)

## 2018-06-04 MED ORDER — FLU VACC QS2019-20 6MOS UP(PF) 60 MCG (15 MCG X 4)/0.5 ML IM SYRG
.5 mL | Freq: Once | INTRAMUSCULAR | 0 refills | Status: DC
Start: 2018-06-04 — End: 2018-06-04

## 2018-06-05 LAB — CULTURE-WOUND/TISSUE/FLUID(AEROBIC ONLY)W/SENSITIVITY

## 2018-06-06 ENCOUNTER — Encounter: Admit: 2018-06-06 | Discharge: 2018-06-06 | Payer: BC Managed Care – HMO

## 2018-06-06 DIAGNOSIS — R079 Chest pain, unspecified: Principal | ICD-10-CM

## 2018-06-06 DIAGNOSIS — F419 Anxiety disorder, unspecified: ICD-10-CM

## 2018-06-06 LAB — CULTURE-BLOOD W/SENSITIVITY

## 2018-06-07 ENCOUNTER — Encounter: Admit: 2018-06-07 | Discharge: 2018-06-07 | Payer: BC Managed Care – HMO

## 2018-06-07 MED ORDER — ALPRAZOLAM 0.5 MG PO TAB
ORAL_TABLET | Freq: Three times a day (TID) | 5 refills | Status: AC | PRN
Start: 2018-06-07 — End: 2018-12-07

## 2018-06-17 ENCOUNTER — Encounter: Admit: 2018-06-17 | Discharge: 2018-06-17 | Payer: BC Managed Care – HMO

## 2018-06-17 ENCOUNTER — Ambulatory Visit: Admit: 2018-06-17 | Discharge: 2018-06-18 | Payer: BC Managed Care – HMO

## 2018-06-17 DIAGNOSIS — F419 Anxiety disorder, unspecified: ICD-10-CM

## 2018-06-17 DIAGNOSIS — R002 Palpitations: ICD-10-CM

## 2018-06-17 DIAGNOSIS — R238 Other skin changes: ICD-10-CM

## 2018-06-17 DIAGNOSIS — I82409 Acute embolism and thrombosis of unspecified deep veins of unspecified lower extremity: ICD-10-CM

## 2018-06-17 DIAGNOSIS — A0472 Enterocolitis due to Clostridium difficile, not specified as recurrent: ICD-10-CM

## 2018-06-17 DIAGNOSIS — R198 Other specified symptoms and signs involving the digestive system and abdomen: ICD-10-CM

## 2018-06-17 DIAGNOSIS — I426 Alcoholic cardiomyopathy: ICD-10-CM

## 2018-06-17 DIAGNOSIS — R9439 Abnormal result of other cardiovascular function study: Principal | ICD-10-CM

## 2018-06-17 DIAGNOSIS — J449 Chronic obstructive pulmonary disease, unspecified: ICD-10-CM

## 2018-06-17 DIAGNOSIS — I2510 Atherosclerotic heart disease of native coronary artery without angina pectoris: ICD-10-CM

## 2018-06-17 DIAGNOSIS — I5022 Chronic systolic (congestive) heart failure: ICD-10-CM

## 2018-06-17 DIAGNOSIS — M549 Dorsalgia, unspecified: ICD-10-CM

## 2018-06-17 DIAGNOSIS — I4891 Unspecified atrial fibrillation: ICD-10-CM

## 2018-06-17 DIAGNOSIS — E785 Hyperlipidemia, unspecified: ICD-10-CM

## 2018-06-18 DIAGNOSIS — S8002XD Contusion of left knee, subsequent encounter: Principal | ICD-10-CM

## 2018-06-23 ENCOUNTER — Encounter: Admit: 2018-06-23 | Discharge: 2018-06-23 | Payer: BC Managed Care – HMO

## 2018-06-23 ENCOUNTER — Ambulatory Visit: Admit: 2018-06-23 | Discharge: 2018-06-24 | Payer: BC Managed Care – HMO

## 2018-06-23 DIAGNOSIS — M549 Dorsalgia, unspecified: ICD-10-CM

## 2018-06-23 DIAGNOSIS — A0472 Enterocolitis due to Clostridium difficile, not specified as recurrent: ICD-10-CM

## 2018-06-23 DIAGNOSIS — I2510 Atherosclerotic heart disease of native coronary artery without angina pectoris: ICD-10-CM

## 2018-06-23 DIAGNOSIS — R238 Other skin changes: ICD-10-CM

## 2018-06-23 DIAGNOSIS — I4891 Unspecified atrial fibrillation: ICD-10-CM

## 2018-06-23 DIAGNOSIS — J449 Chronic obstructive pulmonary disease, unspecified: ICD-10-CM

## 2018-06-23 DIAGNOSIS — R9439 Abnormal result of other cardiovascular function study: Principal | ICD-10-CM

## 2018-06-23 DIAGNOSIS — I82409 Acute embolism and thrombosis of unspecified deep veins of unspecified lower extremity: ICD-10-CM

## 2018-06-23 DIAGNOSIS — E785 Hyperlipidemia, unspecified: ICD-10-CM

## 2018-06-23 DIAGNOSIS — R198 Other specified symptoms and signs involving the digestive system and abdomen: ICD-10-CM

## 2018-06-23 DIAGNOSIS — I5022 Chronic systolic (congestive) heart failure: ICD-10-CM

## 2018-06-23 DIAGNOSIS — F419 Anxiety disorder, unspecified: ICD-10-CM

## 2018-06-23 DIAGNOSIS — I480 Paroxysmal atrial fibrillation: Principal | ICD-10-CM

## 2018-06-23 DIAGNOSIS — I10 Essential (primary) hypertension: ICD-10-CM

## 2018-06-23 DIAGNOSIS — I426 Alcoholic cardiomyopathy: ICD-10-CM

## 2018-06-23 DIAGNOSIS — R002 Palpitations: ICD-10-CM

## 2018-06-23 MED ORDER — SPIRONOLACTONE 25 MG PO TAB
50 mg | ORAL_TABLET | Freq: Every day | ORAL | 3 refills | 46.00000 days | Status: AC
Start: 2018-06-23 — End: 2018-07-04

## 2018-07-04 ENCOUNTER — Encounter: Admit: 2018-07-04 | Discharge: 2018-07-04 | Payer: BC Managed Care – HMO

## 2018-07-04 MED ORDER — SPIRONOLACTONE 25 MG PO TAB
50 mg | ORAL_TABLET | Freq: Every day | ORAL | 1 refills | 90.00000 days | Status: AC
Start: 2018-07-04 — End: 2019-01-10

## 2018-07-09 ENCOUNTER — Encounter: Admit: 2018-07-09 | Discharge: 2018-07-10 | Payer: BC Managed Care – HMO

## 2018-07-09 ENCOUNTER — Encounter: Admit: 2018-07-09 | Discharge: 2018-07-09 | Payer: BC Managed Care – HMO

## 2018-07-10 ENCOUNTER — Encounter: Admit: 2018-07-10 | Discharge: 2018-07-11 | Payer: BC Managed Care – HMO

## 2018-07-11 ENCOUNTER — Encounter: Admit: 2018-07-11 | Discharge: 2018-07-11 | Payer: BC Managed Care – HMO

## 2018-07-11 DIAGNOSIS — I4891 Unspecified atrial fibrillation: ICD-10-CM

## 2018-07-11 LAB — POC GLUCOSE
Lab: 65 mg/dL — ABNORMAL LOW (ref 70–100)
Lab: 77 mg/dL (ref 70–100)

## 2018-07-11 MED ORDER — METOPROLOL SUCCINATE 100 MG PO TB24
100 mg | Freq: Every day | ORAL | 0 refills | Status: DC
Start: 2018-07-11 — End: 2018-07-12
  Administered 2018-07-12 (×2): 100 mg via ORAL

## 2018-07-11 MED ORDER — BUDESONIDE-FORMOTEROL 160-4.5 MCG/ACTUATION IN HFAA
2 | Freq: Two times a day (BID) | RESPIRATORY_TRACT | 0 refills | Status: DC
Start: 2018-07-11 — End: 2018-07-23
  Administered 2018-07-12: 03:00:00 2 via RESPIRATORY_TRACT

## 2018-07-11 MED ORDER — DILTIAZEM 125MG/125ML IV DRIP
5-15 mg/h | INTRAVENOUS | 0 refills | Status: DC
Start: 2018-07-11 — End: 2018-07-15
  Administered 2018-07-12 (×2): 5 mg/h via INTRAVENOUS
  Administered 2018-07-13: 03:00:00 12.5 mg/h via INTRAVENOUS
  Administered 2018-07-13 (×2): 10 mg/h via INTRAVENOUS
  Administered 2018-07-13: 03:00:00 12.5 mg/h via INTRAVENOUS
  Administered 2018-07-13 (×2): 10 mg/h via INTRAVENOUS
  Administered 2018-07-14 (×2): 7.5 mg/h via INTRAVENOUS
  Administered 2018-07-14: 05:00:00 5 mg/h via INTRAVENOUS
  Administered 2018-07-14: 11:00:00 7.5 mg/h via INTRAVENOUS
  Administered 2018-07-14: 05:00:00 5 mg/h via INTRAVENOUS
  Administered 2018-07-14: 08:00:00 7.5 mg/h via INTRAVENOUS

## 2018-07-11 MED ORDER — MAGNESIUM SULFATE IN D5W 1 GRAM/100 ML IV PGBK
1 g | INTRAVENOUS | 0 refills | Status: CP
Start: 2018-07-11 — End: ?
  Administered 2018-07-12 (×4): 1 g via INTRAVENOUS

## 2018-07-11 MED ORDER — SPIRONOLACTONE 50 MG PO TAB
50 mg | Freq: Every day | ORAL | 0 refills | Status: CN
Start: 2018-07-11 — End: ?

## 2018-07-11 MED ORDER — PATCH DOCUMENTATION - LIDOCAINE 5%
Freq: Two times a day (BID) | TRANSDERMAL | 0 refills | Status: DC
Start: 2018-07-11 — End: 2018-07-23

## 2018-07-11 MED ORDER — RIVAROXABAN 20 MG PO TAB
20 mg | Freq: Every day | ORAL | 0 refills | Status: DC
Start: 2018-07-11 — End: 2018-07-23
  Administered 2018-07-12 – 2018-07-22 (×12): 20 mg via ORAL

## 2018-07-11 MED ORDER — ALPRAZOLAM 0.5 MG PO TAB
.5 mg | Freq: Three times a day (TID) | ORAL | 0 refills | Status: DC | PRN
Start: 2018-07-11 — End: 2018-07-23
  Administered 2018-07-13 – 2018-07-23 (×11): 0.5 mg via ORAL

## 2018-07-11 MED ORDER — ALBUTEROL SULFATE 90 MCG/ACTUATION IN HFAA
2 | RESPIRATORY_TRACT | 0 refills | Status: DC | PRN
Start: 2018-07-11 — End: 2018-07-12

## 2018-07-11 MED ORDER — LIDOCAINE 5 % TP PTMD
1 | Freq: Every day | TOPICAL | 0 refills | Status: DC
Start: 2018-07-11 — End: 2018-07-23
  Administered 2018-07-12 – 2018-07-18 (×7): 1 via TOPICAL

## 2018-07-11 MED ORDER — DOFETILIDE 500 MCG PO CAP
500 ug | Freq: Two times a day (BID) | ORAL | 0 refills | Status: DC
Start: 2018-07-11 — End: 2018-07-23
  Administered 2018-07-12 – 2018-07-23 (×24): 500 ug via ORAL

## 2018-07-11 MED ORDER — ERTAPENEM 1GM IVP
1 g | INTRAVENOUS | 0 refills | Status: DC
Start: 2018-07-11 — End: 2018-07-12
  Administered 2018-07-12: 14:00:00 1 g via INTRAVENOUS

## 2018-07-11 MED ORDER — PRAVASTATIN 40 MG PO TAB
40 mg | Freq: Every evening | ORAL | 0 refills | Status: DC
Start: 2018-07-11 — End: 2018-07-23
  Administered 2018-07-12 – 2018-07-23 (×12): 40 mg via ORAL

## 2018-07-11 MED ORDER — IPRATROPIUM-ALBUTEROL 20-100 MCG/ACTUATION IN MIST
1 | Freq: Four times a day (QID) | RESPIRATORY_TRACT | 0 refills | Status: DC
Start: 2018-07-11 — End: 2018-07-23
  Administered 2018-07-12: 03:00:00 1 via RESPIRATORY_TRACT

## 2018-07-11 MED ORDER — PANTOPRAZOLE 40 MG PO TBEC
40 mg | Freq: Every day | ORAL | 0 refills | Status: DC
Start: 2018-07-11 — End: 2018-07-23
  Administered 2018-07-12 – 2018-07-23 (×12): 40 mg via ORAL

## 2018-07-12 ENCOUNTER — Encounter: Admit: 2018-07-12 | Discharge: 2018-07-12 | Payer: BC Managed Care – HMO

## 2018-07-12 DIAGNOSIS — I2510 Atherosclerotic heart disease of native coronary artery without angina pectoris: ICD-10-CM

## 2018-07-12 DIAGNOSIS — A0472 Enterocolitis due to Clostridium difficile, not specified as recurrent: ICD-10-CM

## 2018-07-12 DIAGNOSIS — R198 Other specified symptoms and signs involving the digestive system and abdomen: ICD-10-CM

## 2018-07-12 DIAGNOSIS — I5022 Chronic systolic (congestive) heart failure: ICD-10-CM

## 2018-07-12 DIAGNOSIS — R238 Other skin changes: ICD-10-CM

## 2018-07-12 DIAGNOSIS — Z7901 Long term (current) use of anticoagulants: ICD-10-CM

## 2018-07-12 DIAGNOSIS — R9439 Abnormal result of other cardiovascular function study: Principal | ICD-10-CM

## 2018-07-12 DIAGNOSIS — I82409 Acute embolism and thrombosis of unspecified deep veins of unspecified lower extremity: ICD-10-CM

## 2018-07-12 DIAGNOSIS — J449 Chronic obstructive pulmonary disease, unspecified: ICD-10-CM

## 2018-07-12 DIAGNOSIS — Z86718 Personal history of other venous thrombosis and embolism: ICD-10-CM

## 2018-07-12 DIAGNOSIS — I4891 Unspecified atrial fibrillation: ICD-10-CM

## 2018-07-12 DIAGNOSIS — I426 Alcoholic cardiomyopathy: ICD-10-CM

## 2018-07-12 DIAGNOSIS — M549 Dorsalgia, unspecified: ICD-10-CM

## 2018-07-12 DIAGNOSIS — Z8679 Personal history of other diseases of the circulatory system: ICD-10-CM

## 2018-07-12 DIAGNOSIS — I10 Essential (primary) hypertension: ICD-10-CM

## 2018-07-12 DIAGNOSIS — R002 Palpitations: ICD-10-CM

## 2018-07-12 DIAGNOSIS — E785 Hyperlipidemia, unspecified: ICD-10-CM

## 2018-07-12 DIAGNOSIS — F419 Anxiety disorder, unspecified: ICD-10-CM

## 2018-07-12 LAB — COMPREHENSIVE METABOLIC PANEL
Lab: 11 mg/dL (ref 7–25)
Lab: 134 MMOL/L — ABNORMAL LOW (ref 137–147)
Lab: 18 U/L (ref 7–56)
Lab: 21 MMOL/L (ref 21–30)
Lab: 30 U/L (ref 7–40)
Lab: 54 U/L (ref 25–110)
Lab: 88 mg/dL (ref 70–100)
Lab: 135 MMOL/L — ABNORMAL LOW (ref 137–147)

## 2018-07-12 LAB — MAGNESIUM
Lab: 1.5 mg/dL — ABNORMAL LOW (ref 1.6–2.6)
Lab: 2 mg/dL — ABNORMAL LOW (ref 1.6–2.6)

## 2018-07-12 LAB — TROPONIN-I: Lab: 0 ng/mL (ref 0.0–0.05)

## 2018-07-12 LAB — FREE T4-FREE THYROXINE: Lab: 1 ng/dL (ref 0.6–1.6)

## 2018-07-12 LAB — TSH WITH FREE T4 REFLEX: Lab: 5.3 uU/mL — ABNORMAL HIGH (ref 0.35–5.00)

## 2018-07-12 LAB — CBC AND DIFF
Lab: 10 FL (ref 7–11)
Lab: 13 10*3/uL — ABNORMAL HIGH (ref 4.5–11.0)
Lab: 13 g/dL — ABNORMAL LOW (ref 13.5–16.5)
Lab: 15 % — ABNORMAL HIGH (ref 11–15)
Lab: 39 % — ABNORMAL LOW (ref 40–50)
Lab: 4.4 M/UL (ref 4.4–5.5)
Lab: 8 % — ABNORMAL LOW (ref 24–44)
Lab: 85 % — ABNORMAL HIGH (ref 41–77)
Lab: 88 FL (ref 80–100)
Lab: 9.2 K/UL — ABNORMAL HIGH (ref 4.5–11.0)

## 2018-07-12 LAB — LACTIC ACID (BG - RAPID LACTATE): Lab: 1.7 MMOL/L (ref 0.5–2.0)

## 2018-07-12 LAB — CREATINE KINASE-CPK: Lab: 822 U/L — ABNORMAL HIGH (ref 35–232)

## 2018-07-12 MED ORDER — IMS MIXTURE TEMPLATE
125 mg | Freq: Every day | ORAL | 0 refills | Status: DC
Start: 2018-07-12 — End: 2018-07-12

## 2018-07-12 MED ORDER — MELATONIN 3 MG PO TAB
3 mg | Freq: Every evening | ORAL | 0 refills | Status: DC
Start: 2018-07-12 — End: 2018-07-23
  Administered 2018-07-13 – 2018-07-21 (×9): 3 mg via ORAL

## 2018-07-12 MED ORDER — EMU OIL 120ML
TOPICAL | 0 refills | Status: DC | PRN
Start: 2018-07-12 — End: 2018-07-15

## 2018-07-12 MED ORDER — VANCOMYCIN 1G/250ML D5W IVPB (VIAL2BAG)
1 g | Freq: Two times a day (BID) | INTRAVENOUS | 0 refills | Status: DC
Start: 2018-07-12 — End: 2018-07-12
  Administered 2018-07-12 (×2): 1000 mg via INTRAVENOUS

## 2018-07-12 MED ORDER — ACETAMINOPHEN 325 MG PO TAB
650 mg | ORAL | 0 refills | Status: CP | PRN
Start: 2018-07-12 — End: ?
  Administered 2018-07-13 (×2): 650 mg via ORAL

## 2018-07-12 MED ORDER — VANCOMYCIN PHARMACY TO MANAGE
1 | 0 refills | Status: DC
Start: 2018-07-12 — End: 2018-07-12

## 2018-07-12 MED ORDER — VANCOMYCIN 1,500 MG IVPB
15 mg/kg | Freq: Two times a day (BID) | INTRAVENOUS | 0 refills | Status: DC
Start: 2018-07-12 — End: 2018-07-12

## 2018-07-12 MED ORDER — METOPROLOL SUCCINATE 25 MG PO TB24
25 mg | Freq: Once | ORAL | 0 refills | Status: CP
Start: 2018-07-12 — End: ?
  Administered 2018-07-12: 23:00:00 25 mg via ORAL

## 2018-07-12 MED ORDER — CEFTRIAXONE INJ 2GM IVP
2 g | INTRAVENOUS | 0 refills | Status: DC
Start: 2018-07-12 — End: 2018-07-22
  Administered 2018-07-12 – 2018-07-21 (×10): 2 g via INTRAVENOUS

## 2018-07-12 MED ORDER — POTASSIUM CHLORIDE 20 MEQ PO TBTQ
40 meq | Freq: Once | ORAL | 0 refills | Status: CP
Start: 2018-07-12 — End: ?
  Administered 2018-07-12: 14:00:00 40 meq via ORAL

## 2018-07-12 MED ORDER — ACETAMINOPHEN 500 MG PO TAB
1000 mg | Freq: Once | ORAL | 0 refills | Status: CP
Start: 2018-07-12 — End: ?
  Administered 2018-07-12: 14:00:00 1000 mg via ORAL

## 2018-07-12 MED ORDER — ACETAMINOPHEN 500 MG PO TAB
1000 mg | Freq: Once | ORAL | 0 refills | Status: CP
Start: 2018-07-12 — End: ?
  Administered 2018-07-12: 21:00:00 1000 mg via ORAL

## 2018-07-12 MED ORDER — VANCOMYCIN 1,500 MG IVPB
1500 mg | Freq: Once | INTRAVENOUS | 0 refills | Status: CP
Start: 2018-07-12 — End: ?
  Administered 2018-07-12 (×2): 1500 mg via INTRAVENOUS

## 2018-07-12 MED ORDER — METOPROLOL SUCCINATE 25 MG PO TB24
25 mg | Freq: Every day | ORAL | 0 refills | Status: DC
Start: 2018-07-12 — End: 2018-07-12
  Administered 2018-07-12: 16:00:00 25 mg via ORAL

## 2018-07-12 MED ORDER — IMS MIXTURE TEMPLATE
150 mg | Freq: Every day | ORAL | 0 refills | Status: DC
Start: 2018-07-12 — End: 2018-07-17
  Administered 2018-07-13 – 2018-07-17 (×10): 150 mg via ORAL

## 2018-07-12 MED ORDER — ACETAMINOPHEN 325 MG PO TAB
650 mg | Freq: Once | ORAL | 0 refills | Status: CP
Start: 2018-07-12 — End: ?
  Administered 2018-07-12: 09:00:00 650 mg via ORAL

## 2018-07-12 MED ADMIN — WATER FOR INJECTION, STERILE IJ SOLN [79513]: 20 mL | INTRAVENOUS | @ 15:00:00 | Stop: 2018-07-12 | NDC 00409488723

## 2018-07-13 ENCOUNTER — Encounter: Admit: 2018-07-13 | Discharge: 2018-07-13 | Payer: BC Managed Care – HMO

## 2018-07-13 ENCOUNTER — Inpatient Hospital Stay: Admit: 2018-07-13 | Discharge: 2018-07-13 | Payer: BC Managed Care – HMO

## 2018-07-13 LAB — CBC AND DIFF: Lab: 10 K/UL — ABNORMAL LOW (ref 4.5–11.0)

## 2018-07-13 LAB — COMPREHENSIVE METABOLIC PANEL: Lab: 136 MMOL/L — ABNORMAL LOW (ref >60)

## 2018-07-13 LAB — MAGNESIUM
Lab: 1.4 mg/dL — ABNORMAL LOW (ref 1.6–2.6)
Lab: 1.8 mg/dL — ABNORMAL LOW (ref 1.6–2.6)

## 2018-07-13 MED ORDER — MAGNESIUM SULFATE IN D5W 1 GRAM/100 ML IV PGBK
1 g | INTRAVENOUS | 0 refills | Status: CP
Start: 2018-07-13 — End: ?
  Administered 2018-07-13 (×3): 1 g via INTRAVENOUS

## 2018-07-13 MED ORDER — DIGOXIN 125 MCG (0.125 MG) PO TAB
500 ug | Freq: Once | ORAL | 0 refills | Status: CP
Start: 2018-07-13 — End: ?
  Administered 2018-07-13: 17:00:00 500 ug via ORAL

## 2018-07-13 MED ORDER — DIGOXIN 125 MCG (0.125 MG) PO TAB
250 ug | Freq: Once | ORAL | 0 refills | Status: CP
Start: 2018-07-13 — End: ?
  Administered 2018-07-13: 21:00:00 250 ug via ORAL

## 2018-07-13 MED ORDER — DIGOXIN 125 MCG (0.125 MG) PO TAB
250 ug | Freq: Once | ORAL | 0 refills | Status: CP
Start: 2018-07-13 — End: ?
  Administered 2018-07-14: 03:00:00 250 ug via ORAL

## 2018-07-13 MED ORDER — POTASSIUM CHLORIDE 20 MEQ PO TBTQ
40 meq | Freq: Once | ORAL | 0 refills | Status: CP
Start: 2018-07-13 — End: ?

## 2018-07-13 MED ORDER — OXYCODONE 5 MG PO TAB
5 mg | ORAL | 0 refills | Status: DC | PRN
Start: 2018-07-13 — End: 2018-07-16
  Administered 2018-07-13 – 2018-07-15 (×9): 5 mg via ORAL

## 2018-07-13 MED ORDER — DIGOXIN 125 MCG (0.125 MG) PO TAB
250 ug | Freq: Every day | ORAL | 0 refills | Status: DC
Start: 2018-07-13 — End: 2018-07-15
  Administered 2018-07-14: 18:00:00 250 ug via ORAL

## 2018-07-13 MED ORDER — MAGNESIUM SULFATE IN D5W 1 GRAM/100 ML IV PGBK
1 g | INTRAVENOUS | 0 refills | Status: CP
Start: 2018-07-13 — End: ?
  Administered 2018-07-13 – 2018-07-14 (×2): 1 g via INTRAVENOUS

## 2018-07-14 ENCOUNTER — Encounter: Admit: 2018-07-14 | Discharge: 2018-07-14 | Payer: BC Managed Care – HMO

## 2018-07-14 LAB — CBC AND DIFF: Lab: 10 K/UL — ABNORMAL LOW (ref >60)

## 2018-07-14 LAB — COMPREHENSIVE METABOLIC PANEL: Lab: 135 MMOL/L — ABNORMAL LOW (ref >60)

## 2018-07-14 LAB — GGTP: Lab: 96 U/L — ABNORMAL HIGH (ref 9–64)

## 2018-07-14 LAB — MAGNESIUM: Lab: 1.9 mg/dL — ABNORMAL LOW (ref 1.6–2.6)

## 2018-07-14 MED ORDER — SENNOSIDES-DOCUSATE SODIUM 8.6-50 MG PO TAB
2 | Freq: Two times a day (BID) | ORAL | 0 refills | Status: DC
Start: 2018-07-14 — End: 2018-07-18
  Administered 2018-07-14: 23:00:00 1 via ORAL
  Administered 2018-07-15 (×2): 2 via ORAL

## 2018-07-14 MED ORDER — POLYETHYLENE GLYCOL 3350 17 GRAM PO PWPK
1 | Freq: Two times a day (BID) | ORAL | 0 refills | Status: DC
Start: 2018-07-14 — End: 2018-07-18
  Administered 2018-07-15: 17:00:00 17 g via ORAL

## 2018-07-14 MED ORDER — DIGOXIN 125 MCG (0.125 MG) PO TAB
250 ug | Freq: Once | ORAL | 0 refills | Status: DC
Start: 2018-07-14 — End: 2018-07-14

## 2018-07-14 MED ORDER — GADOBENATE DIMEGLUMINE 529 MG/ML (0.1MMOL/0.2ML) IV SOLN
20 mL | Freq: Once | INTRAVENOUS | 0 refills | Status: CP
Start: 2018-07-14 — End: ?
  Administered 2018-07-14: 09:00:00 20 mL via INTRAVENOUS

## 2018-07-14 MED ORDER — LINEZOLID 600 MG PO TAB
600 mg | Freq: Two times a day (BID) | ORAL | 0 refills | Status: DC
Start: 2018-07-14 — End: 2018-07-22
  Administered 2018-07-14 – 2018-07-22 (×16): 600 mg via ORAL

## 2018-07-15 LAB — DIGOXIN LEVEL: Lab: 1.6 ng/mL — ABNORMAL HIGH (ref 0.5–1.0)

## 2018-07-15 LAB — COMPREHENSIVE METABOLIC PANEL: Lab: 134 MMOL/L — ABNORMAL LOW (ref >60)

## 2018-07-15 LAB — MAGNESIUM: Lab: 1.8 mg/dL — ABNORMAL LOW (ref >60)

## 2018-07-15 LAB — CREATINE KINASE-CPK: Lab: 72 U/L — AB (ref 35–232)

## 2018-07-15 LAB — CBC AND DIFF: Lab: 8.4 K/UL — ABNORMAL LOW (ref >60)

## 2018-07-15 MED ORDER — CLINDAMYCIN IN 5 % DEXTROSE 900 MG/50 ML IV PGBK
900 mg | INTRAVENOUS | 0 refills | Status: DC
Start: 2018-07-15 — End: 2018-07-22
  Administered 2018-07-15 – 2018-07-22 (×20): 900 mg via INTRAVENOUS

## 2018-07-15 MED ORDER — OXYCODONE 5 MG PO TAB
5 mg | ORAL | 0 refills | Status: DC | PRN
Start: 2018-07-15 — End: 2018-07-23
  Administered 2018-07-16 – 2018-07-19 (×15): 5 mg via ORAL

## 2018-07-15 MED ORDER — EMU OIL 120ML
Freq: Four times a day (QID) | TOPICAL | 0 refills | Status: DC
Start: 2018-07-15 — End: 2018-07-23
  Administered 2018-07-15: 18:00:00 120.000 mL via TOPICAL

## 2018-07-16 LAB — CBC AND DIFF: Lab: 8.4 K/UL (ref >60)

## 2018-07-16 LAB — COMPREHENSIVE METABOLIC PANEL: Lab: 133 MMOL/L — ABNORMAL LOW (ref >60)

## 2018-07-16 LAB — MAGNESIUM: Lab: 1.7 mg/dL — ABNORMAL LOW (ref >60)

## 2018-07-16 MED ORDER — LACTOBACILLUS RHAMNOSUS GG 15 BILLION CELL PO CPSP
1 | Freq: Two times a day (BID) | ORAL | 0 refills | Status: DC
Start: 2018-07-16 — End: 2018-07-23
  Administered 2018-07-16 – 2018-07-23 (×14): 1 via ORAL

## 2018-07-16 MED ORDER — MAGNESIUM SULFATE IN D5W 1 GRAM/100 ML IV PGBK
1 g | INTRAVENOUS | 0 refills | Status: CP
Start: 2018-07-16 — End: ?
  Administered 2018-07-16 (×3): 1 g via INTRAVENOUS

## 2018-07-17 LAB — DIGOXIN LEVEL: Lab: 0.9 ng/mL — ABNORMAL LOW (ref 0.5–1.0)

## 2018-07-17 LAB — CULTURE-BLOOD W/SENSITIVITY

## 2018-07-17 LAB — C REACTIVE PROTEIN (CRP): Lab: 11 mg/dL — ABNORMAL HIGH (ref <1.0)

## 2018-07-17 LAB — COMPREHENSIVE METABOLIC PANEL: Lab: 136 MMOL/L — ABNORMAL LOW (ref 137–147)

## 2018-07-17 LAB — CBC AND DIFF: Lab: 9.4 K/UL — ABNORMAL LOW (ref 4.5–11.0)

## 2018-07-17 LAB — MAGNESIUM: Lab: 2.1 mg/dL — ABNORMAL LOW (ref 1.6–2.6)

## 2018-07-17 LAB — SED RATE: Lab: 64 mm/h — ABNORMAL HIGH (ref 0–20)

## 2018-07-17 MED ORDER — METOPROLOL SUCCINATE 100 MG PO TB24
100 mg | Freq: Every day | ORAL | 0 refills | Status: DC
Start: 2018-07-17 — End: 2018-07-23
  Administered 2018-07-18 – 2018-07-23 (×6): 100 mg via ORAL

## 2018-07-17 MED ORDER — ACETAMINOPHEN 500 MG PO TAB
1000 mg | ORAL | 0 refills | Status: DC
Start: 2018-07-17 — End: 2018-07-23
  Administered 2018-07-17 – 2018-07-23 (×18): 1000 mg via ORAL

## 2018-07-17 MED ORDER — NYSTATIN 100,000 UNIT/GRAM TP POWD
Freq: Two times a day (BID) | TOPICAL | 0 refills | Status: DC
Start: 2018-07-17 — End: 2018-07-23
  Administered 2018-07-17: 19:00:00 via TOPICAL

## 2018-07-18 LAB — CULTURE-BLOOD W/SENSITIVITY

## 2018-07-18 LAB — CBC AND DIFF: Lab: 9.1 K/UL — ABNORMAL LOW (ref 4.5–11.0)

## 2018-07-18 LAB — MAGNESIUM: Lab: 2 mg/dL — ABNORMAL LOW (ref 1.6–2.6)

## 2018-07-18 LAB — COMPREHENSIVE METABOLIC PANEL: Lab: 136 MMOL/L — ABNORMAL LOW (ref 137–147)

## 2018-07-18 MED ORDER — SENNOSIDES-DOCUSATE SODIUM 8.6-50 MG PO TAB
2 | Freq: Two times a day (BID) | ORAL | 0 refills | Status: DC | PRN
Start: 2018-07-18 — End: 2018-07-23

## 2018-07-18 MED ORDER — POLYETHYLENE GLYCOL 3350 17 GRAM PO PWPK
1 | Freq: Two times a day (BID) | ORAL | 0 refills | Status: DC | PRN
Start: 2018-07-18 — End: 2018-07-23

## 2018-07-19 LAB — CBC AND DIFF: Lab: 8.7 K/UL — ABNORMAL HIGH (ref >60)

## 2018-07-19 LAB — MAGNESIUM: Lab: 1.9 mg/dL — ABNORMAL LOW (ref >60)

## 2018-07-19 LAB — COMPREHENSIVE METABOLIC PANEL: Lab: 136 MMOL/L — ABNORMAL LOW (ref >60)

## 2018-07-19 MED ORDER — MAGNESIUM OXIDE 400 MG (241.3 MG MAGNESIUM) PO TAB
400 mg | Freq: Once | ORAL | 0 refills | Status: CP
Start: 2018-07-19 — End: ?
  Administered 2018-07-19: 22:00:00 400 mg via ORAL

## 2018-07-20 LAB — CBC AND DIFF: Lab: 7.4 K/UL — ABNORMAL HIGH (ref 4.5–11.0)

## 2018-07-20 LAB — COMPREHENSIVE METABOLIC PANEL: Lab: 139 MMOL/L — ABNORMAL HIGH (ref 137–147)

## 2018-07-20 LAB — MAGNESIUM: Lab: 1.8 mg/dL — ABNORMAL LOW (ref 1.6–2.6)

## 2018-07-20 MED ORDER — EMOLLIENT TP CREA
Freq: Two times a day (BID) | TOPICAL | 0 refills | Status: DC
Start: 2018-07-20 — End: 2018-07-23
  Administered 2018-07-20: 19:00:00 via TOPICAL

## 2018-07-20 MED ORDER — MAGNESIUM SULFATE IN D5W 1 GRAM/100 ML IV PGBK
1 g | Freq: Once | INTRAVENOUS | 0 refills | Status: CP
Start: 2018-07-20 — End: ?
  Administered 2018-07-20: 21:00:00 1 g via INTRAVENOUS

## 2018-07-21 DIAGNOSIS — I480 Paroxysmal atrial fibrillation: Principal | ICD-10-CM

## 2018-07-21 LAB — MAGNESIUM: Lab: 2 mg/dL — ABNORMAL HIGH (ref 1.6–2.6)

## 2018-07-21 LAB — CBC AND DIFF: Lab: 7.4 K/UL — ABNORMAL LOW (ref 4.5–11.0)

## 2018-07-21 LAB — COMPREHENSIVE METABOLIC PANEL: Lab: 138 MMOL/L — ABNORMAL HIGH (ref 137–147)

## 2018-07-21 MED ORDER — PERFLUTREN LIPID MICROSPHERES 1.1 MG/ML IV SUSP
1-20 mL | Freq: Once | INTRAVENOUS | 0 refills | Status: CP | PRN
Start: 2018-07-21 — End: ?
  Administered 2018-07-21: 18:00:00 2 mL via INTRAVENOUS

## 2018-07-22 LAB — CBC AND DIFF: Lab: 5.1 K/UL — ABNORMAL HIGH (ref >60)

## 2018-07-22 LAB — MAGNESIUM: Lab: 2 mg/dL — ABNORMAL LOW (ref 1.6–2.6)

## 2018-07-22 LAB — COMPREHENSIVE METABOLIC PANEL: Lab: 141 MMOL/L — ABNORMAL LOW (ref 137–147)

## 2018-07-22 MED ORDER — ALBUTEROL SULFATE 90 MCG/ACTUATION IN HFAA
2 | RESPIRATORY_TRACT | 0 refills | Status: DC | PRN
Start: 2018-07-22 — End: 2018-07-23

## 2018-07-22 MED ORDER — CEFTRIAXONE INJ 2GM IVP
2 g | INTRAVENOUS | 0 refills | Status: DC
Start: 2018-07-22 — End: 2018-07-23
  Administered 2018-07-23: 17:00:00 2 g via INTRAVENOUS

## 2018-07-22 MED ORDER — CEFTRIAXONE INJ 2GM IVP
2 g | INTRAVENOUS | 0 refills | Status: DC
Start: 2018-07-22 — End: 2018-07-22
  Administered 2018-07-22: 20:00:00 2 g via INTRAVENOUS

## 2018-07-23 ENCOUNTER — Inpatient Hospital Stay: Admit: 2018-07-21 | Discharge: 2018-07-21 | Payer: BC Managed Care – HMO

## 2018-07-23 ENCOUNTER — Inpatient Hospital Stay
Admit: 2018-07-11 | Discharge: 2018-07-23 | Disposition: A | Discharge status: Home / Self Care | Payer: BC Managed Care – HMO | Source: Other Acute Inpatient Hospital | Admitting: Cardiovascular Disease

## 2018-07-23 ENCOUNTER — Inpatient Hospital Stay: Admit: 2018-07-14 | Discharge: 2018-07-14 | Payer: BC Managed Care – HMO

## 2018-07-23 DIAGNOSIS — J9811 Atelectasis: ICD-10-CM

## 2018-07-23 DIAGNOSIS — K219 Gastro-esophageal reflux disease without esophagitis: ICD-10-CM

## 2018-07-23 DIAGNOSIS — I2510 Atherosclerotic heart disease of native coronary artery without angina pectoris: ICD-10-CM

## 2018-07-23 DIAGNOSIS — I480 Paroxysmal atrial fibrillation: Principal | ICD-10-CM

## 2018-07-23 DIAGNOSIS — L03116 Cellulitis of left lower limb: ICD-10-CM

## 2018-07-23 DIAGNOSIS — I493 Ventricular premature depolarization: ICD-10-CM

## 2018-07-23 DIAGNOSIS — M609 Myositis, unspecified: ICD-10-CM

## 2018-07-23 DIAGNOSIS — E785 Hyperlipidemia, unspecified: ICD-10-CM

## 2018-07-23 DIAGNOSIS — A408 Other streptococcal sepsis: ICD-10-CM

## 2018-07-23 DIAGNOSIS — I5022 Chronic systolic (congestive) heart failure: ICD-10-CM

## 2018-07-23 DIAGNOSIS — Z86718 Personal history of other venous thrombosis and embolism: ICD-10-CM

## 2018-07-23 DIAGNOSIS — K1120 Sialoadenitis, unspecified: ICD-10-CM

## 2018-07-23 DIAGNOSIS — R630 Anorexia: ICD-10-CM

## 2018-07-23 DIAGNOSIS — N179 Acute kidney failure, unspecified: ICD-10-CM

## 2018-07-23 DIAGNOSIS — J449 Chronic obstructive pulmonary disease, unspecified: ICD-10-CM

## 2018-07-23 DIAGNOSIS — I426 Alcoholic cardiomyopathy: ICD-10-CM

## 2018-07-23 DIAGNOSIS — M940 Chondrocostal junction syndrome [Tietze]: ICD-10-CM

## 2018-07-23 DIAGNOSIS — Z7901 Long term (current) use of anticoagulants: ICD-10-CM

## 2018-07-23 DIAGNOSIS — Z8719 Personal history of other diseases of the digestive system: ICD-10-CM

## 2018-07-23 DIAGNOSIS — I110 Hypertensive heart disease with heart failure: ICD-10-CM

## 2018-07-23 DIAGNOSIS — E8809 Other disorders of plasma-protein metabolism, not elsewhere classified: ICD-10-CM

## 2018-07-23 DIAGNOSIS — N390 Urinary tract infection, site not specified: ICD-10-CM

## 2018-07-23 LAB — MAGNESIUM: Lab: 1.8 mg/dL — ABNORMAL LOW (ref 1.6–2.6)

## 2018-07-23 LAB — CBC AND DIFF: Lab: 5.4 K/UL — ABNORMAL LOW (ref >60)

## 2018-07-23 LAB — COMPREHENSIVE METABOLIC PANEL: Lab: 140 MMOL/L — ABNORMAL LOW (ref 137–147)

## 2018-07-23 MED ORDER — AMOXICILLIN 500 MG PO CAP
500 mg | ORAL_CAPSULE | Freq: Three times a day (TID) | ORAL | 1 refills | 7.00000 days | Status: AC
Start: 2018-07-23 — End: ?

## 2018-07-25 ENCOUNTER — Encounter: Admit: 2018-07-25 | Discharge: 2018-07-25 | Payer: BC Managed Care – HMO

## 2018-08-04 ENCOUNTER — Ambulatory Visit: Admit: 2018-08-04 | Discharge: 2018-08-05 | Payer: BC Managed Care – HMO

## 2018-08-04 ENCOUNTER — Encounter: Admit: 2018-08-04 | Discharge: 2018-08-04 | Payer: BC Managed Care – HMO

## 2018-08-04 DIAGNOSIS — Z7901 Long term (current) use of anticoagulants: ICD-10-CM

## 2018-08-04 DIAGNOSIS — R238 Other skin changes: ICD-10-CM

## 2018-08-04 DIAGNOSIS — Z86718 Personal history of other venous thrombosis and embolism: ICD-10-CM

## 2018-08-04 DIAGNOSIS — I5022 Chronic systolic (congestive) heart failure: ICD-10-CM

## 2018-08-04 DIAGNOSIS — R198 Other specified symptoms and signs involving the digestive system and abdomen: ICD-10-CM

## 2018-08-04 DIAGNOSIS — J449 Chronic obstructive pulmonary disease, unspecified: ICD-10-CM

## 2018-08-04 DIAGNOSIS — I2510 Atherosclerotic heart disease of native coronary artery without angina pectoris: ICD-10-CM

## 2018-08-04 DIAGNOSIS — R9439 Abnormal result of other cardiovascular function study: Principal | ICD-10-CM

## 2018-08-04 DIAGNOSIS — R002 Palpitations: ICD-10-CM

## 2018-08-04 DIAGNOSIS — I426 Alcoholic cardiomyopathy: ICD-10-CM

## 2018-08-04 DIAGNOSIS — M549 Dorsalgia, unspecified: ICD-10-CM

## 2018-08-04 DIAGNOSIS — F419 Anxiety disorder, unspecified: ICD-10-CM

## 2018-08-04 DIAGNOSIS — E785 Hyperlipidemia, unspecified: ICD-10-CM

## 2018-08-04 DIAGNOSIS — I4891 Unspecified atrial fibrillation: ICD-10-CM

## 2018-08-04 DIAGNOSIS — Z8679 Personal history of other diseases of the circulatory system: ICD-10-CM

## 2018-08-04 DIAGNOSIS — I82409 Acute embolism and thrombosis of unspecified deep veins of unspecified lower extremity: ICD-10-CM

## 2018-08-04 DIAGNOSIS — A0472 Enterocolitis due to Clostridium difficile, not specified as recurrent: ICD-10-CM

## 2018-08-04 DIAGNOSIS — I10 Essential (primary) hypertension: ICD-10-CM

## 2018-08-04 MED ORDER — AMOXICILLIN 500 MG PO CAP
500 mg | ORAL_CAPSULE | ORAL | 0 refills | 7.00000 days | Status: AC
Start: 2018-08-04 — End: ?

## 2018-08-05 DIAGNOSIS — L03116 Cellulitis of left lower limb: Principal | ICD-10-CM

## 2018-08-05 DIAGNOSIS — Z23 Encounter for immunization: ICD-10-CM

## 2018-08-23 ENCOUNTER — Encounter: Admit: 2018-08-23 | Discharge: 2018-08-23 | Payer: BC Managed Care – HMO

## 2018-08-23 ENCOUNTER — Ambulatory Visit: Admit: 2018-08-23 | Discharge: 2018-08-23 | Payer: BC Managed Care – HMO

## 2018-08-23 DIAGNOSIS — A0472 Enterocolitis due to Clostridium difficile, not specified as recurrent: Secondary | ICD-10-CM

## 2018-08-23 DIAGNOSIS — R198 Other specified symptoms and signs involving the digestive system and abdomen: Secondary | ICD-10-CM

## 2018-08-23 DIAGNOSIS — R238 Other skin changes: Secondary | ICD-10-CM

## 2018-08-23 DIAGNOSIS — I10 Essential (primary) hypertension: Secondary | ICD-10-CM

## 2018-08-23 DIAGNOSIS — E785 Hyperlipidemia, unspecified: Secondary | ICD-10-CM

## 2018-08-23 DIAGNOSIS — R9439 Abnormal result of other cardiovascular function study: Secondary | ICD-10-CM

## 2018-08-23 DIAGNOSIS — I2510 Atherosclerotic heart disease of native coronary artery without angina pectoris: Secondary | ICD-10-CM

## 2018-08-23 DIAGNOSIS — I4891 Unspecified atrial fibrillation: Secondary | ICD-10-CM

## 2018-08-23 DIAGNOSIS — I426 Alcoholic cardiomyopathy: Secondary | ICD-10-CM

## 2018-08-23 DIAGNOSIS — R002 Palpitations: Secondary | ICD-10-CM

## 2018-08-23 DIAGNOSIS — Z8679 Personal history of other diseases of the circulatory system: Secondary | ICD-10-CM

## 2018-08-23 DIAGNOSIS — I480 Paroxysmal atrial fibrillation: Secondary | ICD-10-CM

## 2018-08-23 DIAGNOSIS — Z7901 Long term (current) use of anticoagulants: Secondary | ICD-10-CM

## 2018-08-23 DIAGNOSIS — I82409 Acute embolism and thrombosis of unspecified deep veins of unspecified lower extremity: Secondary | ICD-10-CM

## 2018-08-23 DIAGNOSIS — Z86718 Personal history of other venous thrombosis and embolism: Secondary | ICD-10-CM

## 2018-08-23 DIAGNOSIS — M549 Dorsalgia, unspecified: Secondary | ICD-10-CM

## 2018-08-23 DIAGNOSIS — I5022 Chronic systolic (congestive) heart failure: Secondary | ICD-10-CM

## 2018-08-23 DIAGNOSIS — J449 Chronic obstructive pulmonary disease, unspecified: Secondary | ICD-10-CM

## 2018-08-23 DIAGNOSIS — L03116 Cellulitis of left lower limb: Secondary | ICD-10-CM

## 2018-08-23 DIAGNOSIS — F419 Anxiety disorder, unspecified: Secondary | ICD-10-CM

## 2018-09-06 ENCOUNTER — Ambulatory Visit: Admit: 2018-09-06 | Discharge: 2018-09-06 | Payer: BC Managed Care – HMO

## 2018-09-06 ENCOUNTER — Encounter: Admit: 2018-09-06 | Discharge: 2018-09-06 | Payer: BC Managed Care – HMO

## 2018-09-06 DIAGNOSIS — R7989 Other specified abnormal findings of blood chemistry: Secondary | ICD-10-CM

## 2018-09-06 DIAGNOSIS — I10 Essential (primary) hypertension: Secondary | ICD-10-CM

## 2018-09-06 DIAGNOSIS — I4891 Unspecified atrial fibrillation: Secondary | ICD-10-CM

## 2018-09-06 DIAGNOSIS — R197 Diarrhea, unspecified: Secondary | ICD-10-CM

## 2018-09-06 DIAGNOSIS — Z7901 Long term (current) use of anticoagulants: Secondary | ICD-10-CM

## 2018-09-06 DIAGNOSIS — I2510 Atherosclerotic heart disease of native coronary artery without angina pectoris: Secondary | ICD-10-CM

## 2018-09-06 DIAGNOSIS — R9439 Abnormal result of other cardiovascular function study: Secondary | ICD-10-CM

## 2018-09-06 DIAGNOSIS — E785 Hyperlipidemia, unspecified: Secondary | ICD-10-CM

## 2018-09-06 DIAGNOSIS — R238 Other skin changes: Secondary | ICD-10-CM

## 2018-09-06 DIAGNOSIS — F419 Anxiety disorder, unspecified: Secondary | ICD-10-CM

## 2018-09-06 DIAGNOSIS — R198 Other specified symptoms and signs involving the digestive system and abdomen: Secondary | ICD-10-CM

## 2018-09-06 DIAGNOSIS — I426 Alcoholic cardiomyopathy: Secondary | ICD-10-CM

## 2018-09-06 DIAGNOSIS — Z86718 Personal history of other venous thrombosis and embolism: Secondary | ICD-10-CM

## 2018-09-06 DIAGNOSIS — J449 Chronic obstructive pulmonary disease, unspecified: Secondary | ICD-10-CM

## 2018-09-06 DIAGNOSIS — A0472 Enterocolitis due to Clostridium difficile, not specified as recurrent: Secondary | ICD-10-CM

## 2018-09-06 DIAGNOSIS — Z8679 Personal history of other diseases of the circulatory system: Secondary | ICD-10-CM

## 2018-09-06 DIAGNOSIS — M549 Dorsalgia, unspecified: Secondary | ICD-10-CM

## 2018-09-06 DIAGNOSIS — I5022 Chronic systolic (congestive) heart failure: Secondary | ICD-10-CM

## 2018-09-06 DIAGNOSIS — I82409 Acute embolism and thrombosis of unspecified deep veins of unspecified lower extremity: Secondary | ICD-10-CM

## 2018-09-06 DIAGNOSIS — R002 Palpitations: Secondary | ICD-10-CM

## 2018-09-06 LAB — CBC
Lab: 13 K/UL — ABNORMAL HIGH (ref 4.5–11.0)
Lab: 14 g/dL (ref 13.5–16.5)
Lab: 15 % (ref 11–15)
Lab: 273 K/UL (ref 150–400)
Lab: 29 pg (ref 26–34)
Lab: 32 g/dL (ref 32.0–36.0)
Lab: 4.8 M/UL (ref 4.4–5.5)
Lab: 43 % — ABNORMAL HIGH (ref 40–50)
Lab: 9.7 FL (ref 7–11)
Lab: 90 FL (ref 80–100)

## 2018-09-06 MED ORDER — BUDESONIDE-FORMOTEROL 160-4.5 MCG/ACTUATION IN HFAA
2 | Freq: Two times a day (BID) | RESPIRATORY_TRACT | 11 refills | 30.00000 days | Status: AC
Start: 2018-09-06 — End: 2019-09-15

## 2018-09-06 MED ORDER — IPRATROPIUM-ALBUTEROL 20-100 MCG/ACTUATION IN MIST
1 | Freq: Four times a day (QID) | RESPIRATORY_TRACT | 11 refills | 30.00000 days | Status: AC
Start: 2018-09-06 — End: 2019-10-15

## 2018-09-07 LAB — COMPREHENSIVE METABOLIC PANEL
Lab: 10 (ref 3–12)
Lab: 13 U/L (ref 7–56)
Lab: 139 MMOL/L (ref 137–147)
Lab: 26 MMOL/L (ref 21–30)
Lab: 5 MMOL/L (ref 3.5–5.1)
Lab: 54 mL/min — ABNORMAL LOW (ref >60)
Lab: >60 mL/min (ref >60)

## 2018-09-07 LAB — TSH WITH FREE T4 REFLEX: Lab: 1.8 uU/mL — ABNORMAL LOW (ref >60)

## 2018-09-09 ENCOUNTER — Encounter: Admit: 2018-09-09 | Discharge: 2018-09-09 | Payer: BC Managed Care – HMO

## 2018-09-09 MED ORDER — XARELTO 20 MG PO TAB
ORAL_TABLET | Freq: Every day | ORAL | 11 refills | 30.00000 days | Status: AC
Start: 2018-09-09 — End: 2019-09-05

## 2018-10-05 ENCOUNTER — Encounter: Admit: 2018-10-05 | Discharge: 2018-10-05 | Payer: BC Managed Care – HMO

## 2018-10-05 DIAGNOSIS — G4734 Idiopathic sleep related nonobstructive alveolar hypoventilation: ICD-10-CM

## 2018-10-05 DIAGNOSIS — I4819 Other persistent atrial fibrillation: ICD-10-CM

## 2018-10-05 DIAGNOSIS — E7800 Pure hypercholesterolemia, unspecified: Principal | ICD-10-CM

## 2018-10-05 DIAGNOSIS — J449 Chronic obstructive pulmonary disease, unspecified: ICD-10-CM

## 2018-10-05 DIAGNOSIS — I5022 Chronic systolic (congestive) heart failure: ICD-10-CM

## 2018-10-05 DIAGNOSIS — I513 Intracardiac thrombosis, not elsewhere classified: ICD-10-CM

## 2018-10-05 DIAGNOSIS — I10 Essential (primary) hypertension: ICD-10-CM

## 2018-10-05 DIAGNOSIS — K219 Gastro-esophageal reflux disease without esophagitis: ICD-10-CM

## 2018-10-06 MED ORDER — DOFETILIDE 500 MCG PO CAP
ORAL_CAPSULE | Freq: Two times a day (BID) | 3 refills | Status: AC
Start: 2018-10-06 — End: 2019-09-05

## 2018-12-06 ENCOUNTER — Encounter: Admit: 2018-12-06 | Discharge: 2018-12-06 | Payer: BC Managed Care – HMO

## 2018-12-06 DIAGNOSIS — I513 Intracardiac thrombosis, not elsewhere classified: ICD-10-CM

## 2018-12-06 DIAGNOSIS — I5022 Chronic systolic (congestive) heart failure: ICD-10-CM

## 2018-12-06 DIAGNOSIS — J449 Chronic obstructive pulmonary disease, unspecified: ICD-10-CM

## 2018-12-06 DIAGNOSIS — F419 Anxiety disorder, unspecified: ICD-10-CM

## 2018-12-06 DIAGNOSIS — G4734 Idiopathic sleep related nonobstructive alveolar hypoventilation: ICD-10-CM

## 2018-12-06 DIAGNOSIS — I4819 Other persistent atrial fibrillation: ICD-10-CM

## 2018-12-06 DIAGNOSIS — E7800 Pure hypercholesterolemia, unspecified: ICD-10-CM

## 2018-12-06 DIAGNOSIS — R079 Chest pain, unspecified: Principal | ICD-10-CM

## 2018-12-06 DIAGNOSIS — K219 Gastro-esophageal reflux disease without esophagitis: ICD-10-CM

## 2018-12-06 DIAGNOSIS — I10 Essential (primary) hypertension: ICD-10-CM

## 2018-12-06 MED ORDER — PRAVASTATIN 40 MG PO TAB
ORAL_TABLET | Freq: Every day | ORAL | 0 refills | 90.00000 days | Status: DC
Start: 2018-12-06 — End: 2019-01-10

## 2018-12-06 MED ORDER — ALPRAZOLAM 0.5 MG PO TAB
ORAL_TABLET | Freq: Three times a day (TID) | 0 refills | PRN
Start: 2018-12-06 — End: ?

## 2018-12-06 MED ORDER — METOPROLOL SUCCINATE 100 MG PO TB24
ORAL_TABLET | Freq: Every day | ORAL | 11 refills | 90.00000 days | Status: DC
Start: 2018-12-06 — End: 2019-11-21

## 2018-12-07 ENCOUNTER — Encounter: Admit: 2018-12-07 | Discharge: 2018-12-07 | Payer: BC Managed Care – HMO

## 2018-12-07 DIAGNOSIS — R079 Chest pain, unspecified: Principal | ICD-10-CM

## 2018-12-07 DIAGNOSIS — F419 Anxiety disorder, unspecified: ICD-10-CM

## 2018-12-07 MED ORDER — ALPRAZOLAM 0.5 MG PO TAB
.5 mg | ORAL_TABLET | Freq: Three times a day (TID) | ORAL | 5 refills | Status: DC | PRN
Start: 2018-12-07 — End: 2019-03-02

## 2019-01-07 ENCOUNTER — Encounter: Admit: 2019-01-07 | Discharge: 2019-01-07 | Payer: BC Managed Care – HMO

## 2019-01-10 MED ORDER — SPIRONOLACTONE 25 MG PO TAB
ORAL_TABLET | Freq: Every day | ORAL | 3 refills | 90.00000 days | Status: DC
Start: 2019-01-10 — End: 2019-03-02

## 2019-01-10 MED ORDER — PRAVASTATIN 40 MG PO TAB
ORAL_TABLET | Freq: Every day | ORAL | 3 refills | 90.00000 days | Status: DC
Start: 2019-01-10 — End: 2019-06-23

## 2019-02-20 IMAGING — CR CHEST
1 series · 1 of 1 positions shown · non-contrast
Comparison: none

[chest port x-wise]
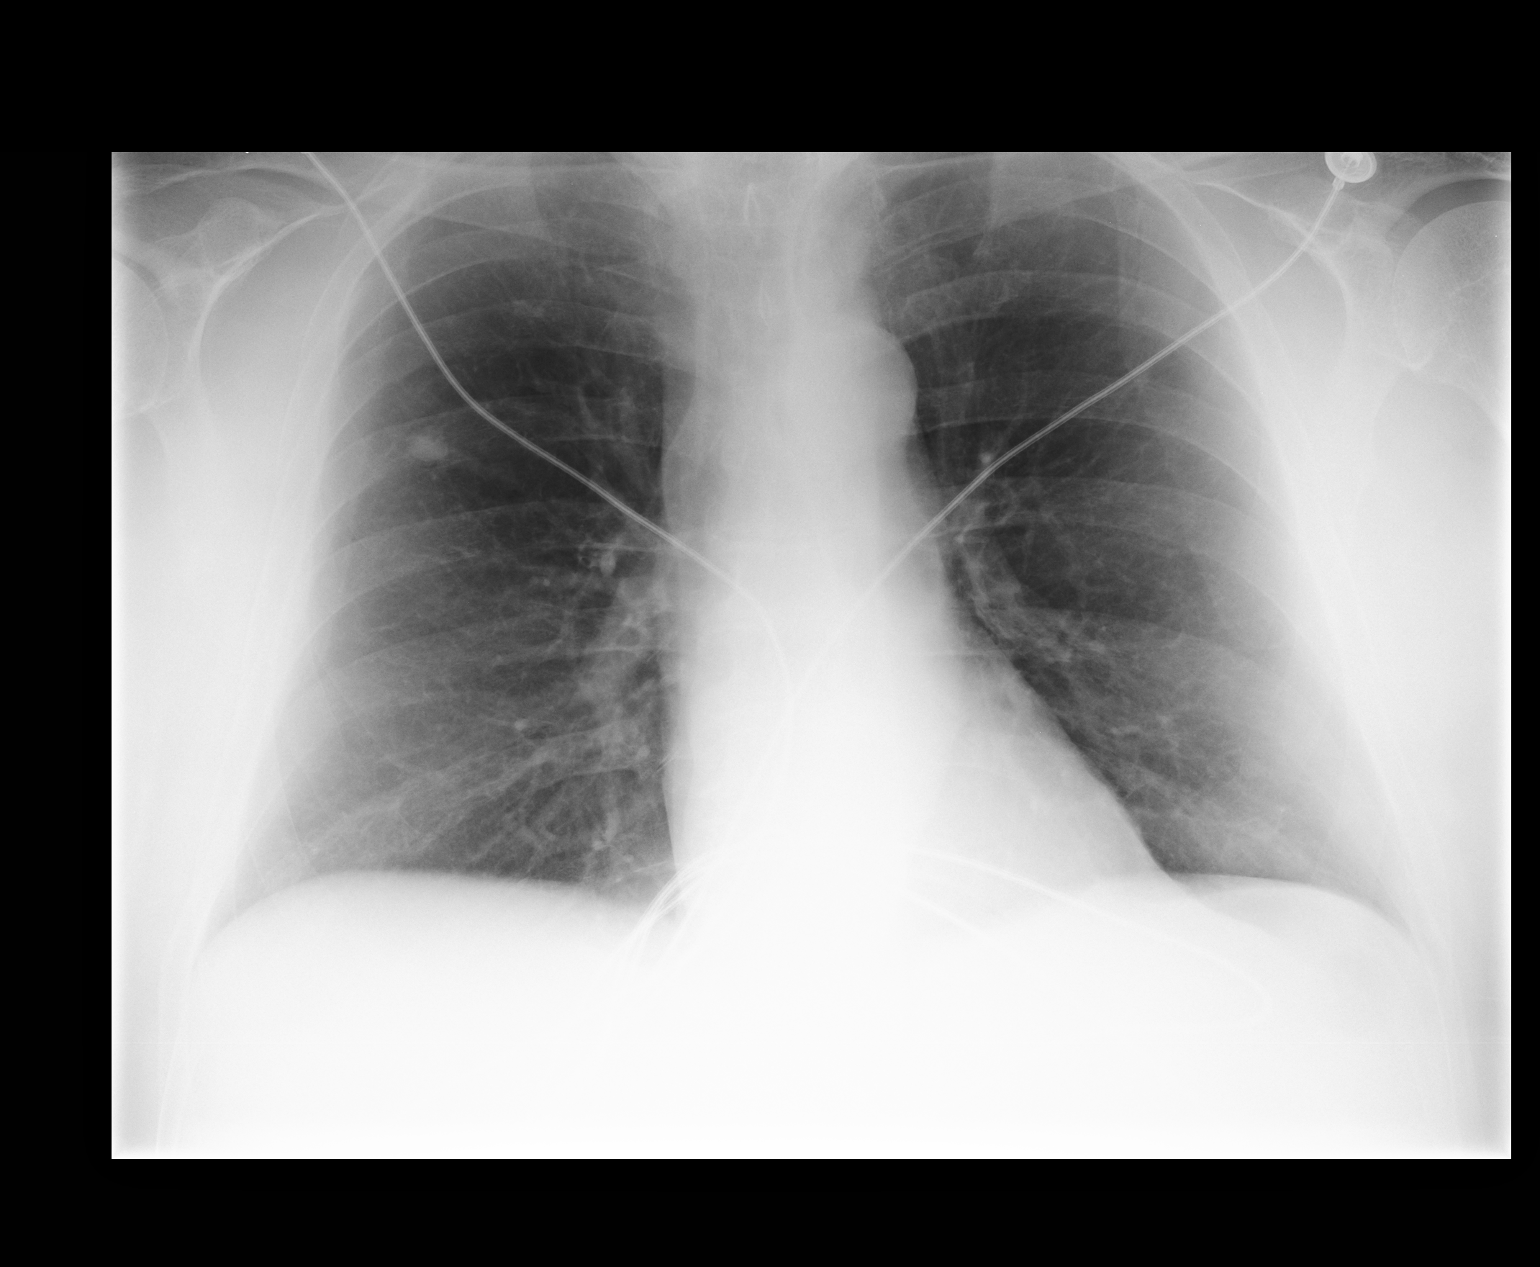

[1 of 1 positions shown; findings below may reference images not displayed]

DIAGNOSTIC STUDIES

EXAM
RADIOLOGICAL EXAMINATION, CHEST; SINGLE VIEW, FRONTAL CPT 75656

INDICATION
cough, fever, chest pain
pt states, cough, fever x1 week, rt sided chest pain after he felt a pop in his anterior chest when
coughing - hx COPD, HTN, A-fib - AK

TECHNIQUE
Single AP view chest

COMPARISONS
Two-view chest November 04, 2016

FINDINGS
Calcified granulomas are appreciated in the right upper lobe. There is no focal consolidation,
effusion, or pneumothorax. Cardiac silhouette is within normal limits. The bony thorax is intact.

IMPRESSION
No acute cardiopulmonary abnormality.

Tech Notes:

pt states, cough, fever x1 week, rt sided chest pain after he felt a pop in his anterior chest when
coughing - hx COPD, HTN, A-fib - AK

## 2019-03-02 ENCOUNTER — Encounter: Admit: 2019-03-02 | Discharge: 2019-03-02

## 2019-03-02 ENCOUNTER — Ambulatory Visit: Admit: 2019-03-02 | Discharge: 2019-03-03

## 2019-03-02 DIAGNOSIS — E785 Hyperlipidemia, unspecified: Secondary | ICD-10-CM

## 2019-03-02 DIAGNOSIS — I2510 Atherosclerotic heart disease of native coronary artery without angina pectoris: Secondary | ICD-10-CM

## 2019-03-02 DIAGNOSIS — I426 Alcoholic cardiomyopathy: Secondary | ICD-10-CM

## 2019-03-02 DIAGNOSIS — J449 Chronic obstructive pulmonary disease, unspecified: Secondary | ICD-10-CM

## 2019-03-02 DIAGNOSIS — R079 Chest pain, unspecified: Secondary | ICD-10-CM

## 2019-03-02 DIAGNOSIS — I4891 Unspecified atrial fibrillation: Secondary | ICD-10-CM

## 2019-03-02 DIAGNOSIS — I5022 Chronic systolic (congestive) heart failure: Secondary | ICD-10-CM

## 2019-03-02 DIAGNOSIS — I480 Paroxysmal atrial fibrillation: Secondary | ICD-10-CM

## 2019-03-02 DIAGNOSIS — A0472 Enterocolitis due to Clostridium difficile, not specified as recurrent: Secondary | ICD-10-CM

## 2019-03-02 DIAGNOSIS — I82409 Acute embolism and thrombosis of unspecified deep veins of unspecified lower extremity: Secondary | ICD-10-CM

## 2019-03-02 DIAGNOSIS — Z8679 Personal history of other diseases of the circulatory system: Secondary | ICD-10-CM

## 2019-03-02 DIAGNOSIS — R002 Palpitations: Secondary | ICD-10-CM

## 2019-03-02 DIAGNOSIS — Z86718 Personal history of other venous thrombosis and embolism: Secondary | ICD-10-CM

## 2019-03-02 DIAGNOSIS — R9439 Abnormal result of other cardiovascular function study: Secondary | ICD-10-CM

## 2019-03-02 DIAGNOSIS — M549 Dorsalgia, unspecified: Secondary | ICD-10-CM

## 2019-03-02 DIAGNOSIS — I10 Essential (primary) hypertension: Secondary | ICD-10-CM

## 2019-03-02 DIAGNOSIS — F419 Anxiety disorder, unspecified: Secondary | ICD-10-CM

## 2019-03-02 DIAGNOSIS — Z7901 Long term (current) use of anticoagulants: Secondary | ICD-10-CM

## 2019-03-02 DIAGNOSIS — R198 Other specified symptoms and signs involving the digestive system and abdomen: Secondary | ICD-10-CM

## 2019-03-02 DIAGNOSIS — R238 Other skin changes: Secondary | ICD-10-CM

## 2019-03-02 MED ORDER — ALPRAZOLAM 0.5 MG PO TAB
.5 mg | ORAL_TABLET | Freq: Three times a day (TID) | ORAL | 5 refills | Status: DC | PRN
Start: 2019-03-02 — End: 2019-09-15

## 2019-03-02 NOTE — Assessment & Plan Note
I refilled his prescription for alprazolam today.

## 2019-03-02 NOTE — Assessment & Plan Note
He'll check BP after we stop spironolactone.  Goal is average 130/80 or less.

## 2019-03-02 NOTE — Assessment & Plan Note
Lab Results   Component Value Date    CHOL 170 11/19/2017    TRIG 100 11/19/2017    HDL 42 11/19/2017    LDL 108 (H) 11/19/2017    VLDL 12 07/24/2016    NONHDLCHOL 128 11/19/2017    CHOLHDLC 4.0 11/19/2017

## 2019-03-02 NOTE — Progress Notes
Date of Service: 03/02/2019    Jonathan Barr is a 63 y.o. male.       HPI     Tracie was in the [place] office today for follow-up regarding paroxysmal atrial fibrillation.  Overall, I think he is doing pretty good but he has been pretty nervous about the whole COVID-19 pandemic and his risk for disease complications.    He has been real active and has been doing projects around the yard and house with his son.  He has not had any problems with palpitations breathlessness or fluid retention.    He has a little bit of breast tenderness that I think is a side effect of spironolactone and we talked about discontinuing the drug today.  His echocardiogram in December showed that his LV function has normalized so I really think it is okay to get him off Spironolactone.  We will need to monitor his blood pressure after we stop the drug.    He is also had some punctate lesions on the legs that I think are probably some type of chigger bite.  He is real sensitive this sort of thing after being hospitalized for severe cellulitis last year.         Vitals:    03/02/19 1257 03/02/19 1304   BP: 120/78 116/80   BP Source: Arm, Right Upper Arm, Left Upper   Pulse: 74    Weight: 91.2 kg (201 lb)    Height: 1.854 m (6' 1)    PainSc: Zero      Body mass index is 26.52 kg/m???.     Past Medical History  Patient Active Problem List    Diagnosis Date Noted   ??? Cellulitis of left leg 07/12/2018   ??? AKI (acute kidney injury) (HCC) 07/12/2018   ??? History of DVT (deep vein thrombosis) 07/12/2018   ??? Chronic anticoagulation, on rivaroxiban 07/12/2018   ??? Atrial fibrillation with rapid ventricular response (HCC) 07/11/2018   ??? Sepsis (HCC) 07/11/2018   ??? Traumatic hematoma of knee 05/31/2018   ??? 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 Umbilical hernia 03/30/2016     Added automatically from request for surgery [id number]     ??? Paroxysmal atrial fibrillation (HCC) 12/20/2015 02/22/01 Holter:  Predominant SR, Rare VPD's with bigeminy.  Rare non-sustained SVT.  Rare APD's    12/20/15 TEE with evidence of LA thrombus. Canceled DCCV. Digoxin loaded.  12/2015 - Successful TEE-cardioversion.  Started on Tikosyn.     ??? COPD (chronic obstructive pulmonary disease) (HCC) 12/18/2015   ??? Nocturnal oxygen desaturation - O2 use 12/18/2015   ??? Colon polyp 02/07/2013     12/2012 - Colonoscopy     ??? GERD (gastroesophageal reflux disease) 02/07/2013     12/2012- EGD negative for Barrett's     ??? Pulmonary nodule 02/07/2013     12/2012 - CT-chest 4.7 mm nodular opacity of peripheral left lower lobe.  F/U CT recommended in 6 months.     ??? Essential hypertension 04/24/2011   ??? History of tobacco use, quit in 2012 04/24/2011   ??? History of alcoholic cardiomyopathy      EF  48%  per stress echo 02/29/00.    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  Normal study.     ??? Anxiety    ??? Chest pain 09/24/2006        a.  Stress Echo 02/29/00 with mild resting global LV asyneresis. Induced inf and post wall myocardial ischemia      b. 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      c.  Cardiac cath 2/08 - Normal coronaries, normal LV function       ??? Hyperlipidemia          Review of Systems   Constitution: Negative.   HENT: Negative.    Eyes: Negative.    Cardiovascular: Negative.    Respiratory: Negative.    Endocrine: Negative.    Hematologic/Lymphatic: Negative.    Skin: Positive for suspicious lesions (legs).   Musculoskeletal: Negative.    Gastrointestinal: Negative.    Genitourinary: Positive for frequency.   Neurological: Negative.    Psychiatric/Behavioral: Negative.    Allergic/Immunologic: Negative.        Physical Exam    Physical Exam   General Appearance: no distress   Skin: several small, erythematous, punctate lesions on lower legs bilaterally, L > R Digits and Nails: no cyanosis or clubbing   Eyes: conjunctivae and lids normal, pupils are equal and round   Teeth/Gums/Palate: dentition unremarkable, no lesions   Lips & Oral Mucosa: no pallor or cyanosis   Neck Veins: normal JVP , neck veins are not distended   Thyroid: no nodules, masses, tenderness or enlargement   Chest Inspection: chest is normal in appearance   Respiratory Effort: breathing comfortably, no respiratory distress   Auscultation/Percussion: lungs clear to auscultation, no rales or rhonchi, no wheezing   PMI: PMI not enlarged or displaced   Cardiac Rhythm: regular rhythm and normal rate   Cardiac Auscultation: S1, S2 normal, no rub, no gallop   Murmurs: no murmur   Peripheral Circulation: normal peripheral circulation   Carotid Arteries: normal carotid upstroke bilaterally, no bruits   Radial Arteries: normal symmetric radial pulses   Abdominal Aorta: no abdominal aortic bruit   Pedal Pulses: normal symmetric pedal pulses   Lower Extremity Edema: no lower extremity edema   Abdominal Exam: soft, non-tender, no masses, bowel sounds normal   Liver & Spleen: no organomegaly   Gait & Station: walks without assistance   Muscle Strength: normal muscle tone   Orientation: oriented to time, place and person   Affect & Mood: appropriate and sustained affect   Language and Memory: patient responsive and seems to comprehend information   Neurologic Exam: neurological assessment grossly intact   Other: moves all extremities        Cardiovascular Studies    EKG:  Sinus rhythm, rate 67.  QTc 440 msec.    Problems Addressed Today  Encounter Diagnoses   Name Primary?   ??? Paroxysmal atrial fibrillation (HCC) Yes   ??? Chest pain, unspecified type    ??? Anxiety        Assessment and Plan       Hyperlipidemia  Lab Results   Component Value Date    CHOL 170 11/19/2017    TRIG 100 11/19/2017    HDL 42 11/19/2017    LDL 108 (H) 11/19/2017    VLDL 12 07/24/2016    NONHDLCHOL 128 11/19/2017    CHOLHDLC 4.0 11/19/2017 Essential hypertension  He'll check BP after we stop spironolactone.  Goal is average 130/80 or less.    Paroxysmal atrial fibrillation (HCC)  Today's EKG shows normal QTC and we will plan to continue Tikosyn.  He is on Xarelto and has had no significant bleeding complications.  Anxiety  I refilled his prescription for alprazolam today.      Current Medications (including today's revisions)  ??? acetaminophen SR(+) (TYLENOL) 650 mg tablet Take 650 mg by mouth every 6 hours as needed for Pain.   ??? albuterol (VENTOLIN HFA, PROAIR HFA, PROVENTIL HFA) 90 mcg/actuation inhaler Inhale 2 Puffs by mouth into the lungs every 6 hours as needed for Wheezing or Shortness of Breath. Shake well before use.   ??? ALPRAZolam (XANAX) 0.5 mg tablet Take one tablet by mouth three times daily as needed.   ??? aspirin/acetaminophen/caffeine (EXCEDRIN MIGRAINE) 250/250/65 mg tab 1 tablet.   ??? budesonide-formoteroL (SYMBICORT) 160-4.5 mcg/actuation inhalation Inhale two puffs by mouth into the lungs twice daily.   ??? dofetilide (TIKOSYN) 500 mcg capsule TAKE 1 CAPSULE BY MOUTH TWICE DAILY   ??? ipratropium/albuterol (COMBIVENT RESPIMAT) 20-100 mcg/actuation inhaler Inhale one puff by mouth into the lungs four times daily.   ??? L.ACID/L.CASEI/B.BIF/B.LON/FOS (PROBIOTIC BLEND PO) Take 1 tablet by mouth every 48 hours.   ??? Magnesium Oxide 500 mg tab TWICE A DAY   ??? metoprolol XL (TOPROL XL) 100 mg extended release tablet Take 1 tablet by mouth once daily   ??? omeprazole DR(+) (PRILOSEC) 20 mg capsule Take 20 mg by mouth daily.   ??? pravastatin (PRAVACHOL) 40 mg tablet TAKE 1 TABLET BY MOUTH ONCE DAILY AT BEDTIME   ??? XARELTO 20 mg tablet TAKE 1 TABLET BY MOUTH ONCE DAILY WITH  DINNER

## 2019-03-02 NOTE — Assessment & Plan Note
Today's EKG shows normal QTC and we will plan to continue Tikosyn.  He is on Xarelto and has had no significant bleeding complications.

## 2019-03-22 ENCOUNTER — Encounter: Admit: 2019-03-22 | Discharge: 2019-03-22

## 2019-03-22 MED ORDER — LOSARTAN 50 MG PO TAB
50 mg | ORAL_TABLET | Freq: Every day | ORAL | 3 refills | 30.00000 days | Status: DC
Start: 2019-03-22 — End: 2020-02-06

## 2019-03-22 NOTE — Telephone Encounter
Received message on triage line stating that he was having high blood pressure.  Called pt to discuss.  He states that at his last office visit, Dr. Ricard Dillon discontinued spironolactone due to breast pain.  He states that the soreness has improved since stopping the medication, but he feels like his blood pressure has been starting to creep up.  BP readings for the past couple of weeks are as follows:  125/86, 95/62, 126/61, 135/88, 123/93, 130/84, HR in the 60s.  This morning his blood pressure was 149/99 and he did have a headache.  He states that he took some aspirin and rechecked his blood pressure and it was down to 138/92.  He states that he is feeling okay, no other complaints other than the headache today.  He is taking his other medications as prescribed.    Will route to Dr. Ricard Dillon for recommendations.

## 2019-03-22 NOTE — Telephone Encounter
Jonathan Cowboy, MD  Baldwin Crown, RN   Caller: Unspecified (Today, 9:51 AM)            Let's try losartan 50 mg/day. We'll need to avoid HCTZ with him on Tikosyn.      Left message with recommendations.  Instructed pt to continue to monitor BP and call us if readings remain high, or get too low.  Left callback number for any questions or concerns.

## 2019-04-20 ENCOUNTER — Encounter: Admit: 2019-04-20 | Discharge: 2019-04-20

## 2019-04-20 NOTE — Telephone Encounter
04/20/2019 2:11 PM   Patient called stating he is having elevated blood pressures in the morning before he take his morning medications. He reports his AM readings run 140-150's/80-90's and the evening readings run 100-110's /70's. He has no complaints, but is concerned with the high readings and wonders if he needs further medication adjustments. Current medications were review and are listed correctly in the chart. Will review with Dr Guy Sandifer for recommendations.  Wilder Glade, LPN

## 2019-04-27 NOTE — Telephone Encounter
04/27/2019 8:14 AM   Attempted to reach patient, left voicemail for a return call to discuss medications change.  Wilder Glade, LPN      ----- Message -----  From: Michiel Cowboy, MD  Sent: 04/27/2019   5:44 AM CDT  To: Wilder Glade, LPN    Interesting!  I probably shouldn't have stopped his spironolactone.  LEt's re-start at 25 mg/day.  Thanks.

## 2019-04-28 MED ORDER — SPIRONOLACTONE 25 MG PO TAB
25 mg | ORAL_TABLET | Freq: Every day | ORAL | 5 refills | 90.00000 days | Status: DC
Start: 2019-04-28 — End: 2019-09-05

## 2019-04-28 NOTE — Telephone Encounter
04/28/2019 9:30 AM   Spoke with patient, Dr Guy Sandifer recommendations given to patient. He voiced understanding. Script sent to [facility]. Patient will continue to keep blood pressure log and call us if needed.  Wilder Glade, LPN

## 2019-05-07 ENCOUNTER — Encounter: Admit: 2019-05-07 | Discharge: 2019-05-07 | Payer: BC Managed Care – HMO

## 2019-05-07 ENCOUNTER — Ambulatory Visit: Admit: 2019-05-07 | Discharge: 2019-05-08 | Payer: BC Managed Care – HMO

## 2019-05-07 MED ORDER — AMOXICILLIN 500 MG PO CAP
500 mg | ORAL_CAPSULE | Freq: Two times a day (BID) | ORAL | 0 refills | 7.00000 days | Status: AC
Start: 2019-05-07 — End: ?

## 2019-05-07 MED ORDER — OFLOXACIN 0.3 % OT DROP
5 [drp] | Freq: Two times a day (BID) | OTIC | 0 refills | Status: AC
Start: 2019-05-07 — End: ?

## 2019-05-07 NOTE — Patient Instructions
Pharyngitis: Strep (Presumed)    You have pharyngitis (sore throat). The healthcare staff may think your sore throat is caused by a bacteria called Group A streptococcus (strep).?This is often called strep throat. This is diagnosed by either a rapid strep test, which gives immediate results, or a throat culture, or both. Strep throat can cause throat pain that is worse when swallowing, aching all over, headache,?swollen lymph nodes or glands in the neck, and fever. The infection is contagious. It often spreads by coughing, kissing,?or touching others after touching your mouth or nose. An antibiotic is given to treat the infection. Antibiotics are prescribed by doctors to treat bacterial infections, not viral infections.   Home care  ? Rest at home. Drink plenty of fluids so you won?t get dehydrated.  ? Stay home from work or school for the first 24 hours of taking the antibiotics, or as directed by the healthcare provider. After this time, you won't be contagious. You can then return to work or school if you are feeling better, or as directed by the provider.?  ? Take the antibiotic medicine for the full 10 days, or as directed by the provider, even when you feel better or your symptoms improve. This is very important to make sure the infection is fully treated. It's also important to prevent medicine-resistant germs from growing. It's also the best way to prevent rheumatic fever, which can affect your heart and other parts of the body.?If you were given an antibiotic shot, your provider will tell you if more antibiotics are needed.  ? You may use acetaminophen or ibuprofen to control pain or fever, unless another medicine was prescribed for this. If you have chronic liver or kidney disease or ever had a stomach ulcer or gastrointestinal bleeding, talk with your provider before using these medicines.  ? Use throat lozenges or a throat-numbing spray to help reduce throat pain. Gargling with warm salt water can also help reduce throat pain. Dissolve 1/2 teaspoon of salt in 1 glass of warm water.?  ? Don?t eat salty or spicy foods. These can irritate the throat.    Follow-up care  Follow up with your healthcare provider or our staff if you don't feel better in 72 hours, or as directed.   When to get medical advice  Call your healthcare provider right away if any of these occur:   ? Fever of?100.4?F (38?C), or higher, or as directed by your provider  ? New or worse ear pain, sinus pain, or headache  ? Painful lumps in the back of neck  ? Stiff neck  ? Lymph nodes that get larger or neck swelling  ? Can?t open mouth wide due to throat pain  ? Signs of dehydration, such as very dark urine or no urine, sunken eyes, dizziness  ? Noisy breathing  ? Muffled voice  ? New rash  ? Symptoms are worse  ? New symptoms develop  Call 911  Call 911 if you have any of these symptoms:   ? Can't swallow, especially saliva, with a lot of drooling  ? Trouble breathing or wheezing  ? Feeling faint  ? Can't talk  ? Feeling of doom  Prevention  Here are steps you can take to help prevent an infection:  ? Keep good handwashing habits.  ? Don?t have close contact with people who have sore throats, colds, or other upper respiratory infections.  ? Don?t smoke, and stay away from secondhand smoke.  ? Stay up to date with  all of your vaccines.  StayWell last reviewed this educational content on 09/17/2018  ? 2000-2020 [employer], [place]. [address] [place], [place] [address]. All rights reserved. This information is not intended as a substitute for professional medical care. Always follow your healthcare professional's instructions.        External Ear Infection (Adult)    External otitis (also called ?swimmer?s ear?) is an infection in the ear canal. It is often caused by bacteria or fungus. It can occur a few days after water gets trapped in the ear canal (from swimming or bathing). It can also occur after cleaning too deeply in the ear canal with a cotton swab or other object. Sometimes, hair care products get into the ear canal and cause this problem.  Symptoms can include pain, fever, itching, redness, drainage, or swelling of the ear canal. Temporary hearing loss may also occur.  Home care  ? Do not try to clean the ear canal. This can push pus and bacteria deeper into the canal.  ? Use prescribed ear drops as directed. These help reduce swelling and fight the infection. If an ear wick was placed in the ear canal, apply drops right onto the end of the wick. The wick will draw the medicine into the ear canal even if it is swollen closed.  ? A cotton ball may be loosely placed in the outer ear to absorb any drainage.  ? You may use acetaminophen or ibuprofen to control pain, unless another medicine?was prescribed. Note: If you have chronic liver or kidney disease or ever had a stomach ulcer or GI bleeding, talk to your healthcare provider before taking any of these medicines.  ? Do not allow water to get into your ear when bathing. Also, don't swim until the infection has cleared.  Prevention  ? Keep your ears dry. This helps lower the risk of infection. Dry your ears with a towel or hair dryer after getting wet. Also, use ear plugs when swimming.  ? Do not stick any objects in the ear to remove wax.  ? If you feel water trapped in your ear, use ear drops right away. You can get these drops over the counter at most drugstores. They work by removing water from the ear canal.  Follow-up care  Follow up with your healthcare provider in 1 week, or as advised.  When to seek medical advice  Call your healthcare provider right away if any of these occur:  ? Ear pain becomes worse or doesn?t improve after 3 days of treatment  ? Redness or swelling of the outer ear occurs or gets worse  ? Headache  ? Painful or stiff neck  ? Drowsiness or confusion ? Fever of 100.4?F (38?C) or higher, or as directed by your healthcare provider  ? Seizure  StayWell last reviewed this educational content on 05/17/2016  ? 2000-2020 [employer], [place]. [address], [place], [place] [address]. All rights reserved. This information is not intended as a substitute for professional medical care. Always follow your healthcare professional's instructions.

## 2019-05-07 NOTE — Progress Notes
Date of Service: 05/07/2019    Jonathan Barr is a 63 y.o. male.  DOB: 04-Apr-1956  MRN: [id number]     Subjective:             Sore Throat    This is a new problem. The current episode started in the past 7 days (three days ago). The problem has been gradually worsening. The pain is worse on the right side. There has been no fever. The pain is at a severity of 6/10. Associated symptoms include congestion (mild), coughing (baseline), ear pain, headaches, a hoarse voice, neck pain, swollen glands and trouble swallowing. Pertinent negatives include no abdominal pain, diarrhea, drooling, ear discharge, plugged ear sensation, shortness of breath or vomiting. He has had no exposure to strep or mono. He has tried cool liquids and acetaminophen for the symptoms.            Review of Systems   HENT: Positive for congestion (mild), ear pain, hoarse voice, sore throat and trouble swallowing. Negative for drooling and ear discharge.    Respiratory: Positive for cough (baseline). Negative for shortness of breath.    Gastrointestinal: Negative for abdominal pain, diarrhea and vomiting.   Musculoskeletal: Positive for neck pain.   Neurological: Positive for headaches.         Objective:         ? acetaminophen SR(+) (TYLENOL) 650 mg tablet Take 650 mg by mouth every 6 hours as needed for Pain.   ? albuterol (VENTOLIN HFA, PROAIR HFA, PROVENTIL HFA) 90 mcg/actuation inhaler Inhale 2 Puffs by mouth into the lungs every 6 hours as needed for Wheezing or Shortness of Breath. Shake well before use.   ? ALPRAZolam (XANAX) 0.5 mg tablet Take one tablet by mouth three times daily as needed.   ? aspirin/acetaminophen/caffeine (EXCEDRIN MIGRAINE) 250/250/65 mg tab 1 tablet.   ? budesonide-formoteroL (SYMBICORT) 160-4.5 mcg/actuation inhalation Inhale two puffs by mouth into the lungs twice daily.   ? dofetilide (TIKOSYN) 500 mcg capsule TAKE 1 CAPSULE BY MOUTH TWICE DAILY   ? ipratropium/albuterol (COMBIVENT RESPIMAT) 20-100 mcg/actuation inhaler Inhale one puff by mouth into the lungs four times daily.   ? L.ACID/L.CASEI/B.BIF/B.LON/FOS (PROBIOTIC BLEND PO) Take 1 tablet by mouth every 48 hours.   ? losartan (COZAAR) 50 mg tablet Take one tablet by mouth daily.   ? Magnesium Oxide 500 mg tab TWICE A DAY   ? metoprolol XL (TOPROL XL) 100 mg extended release tablet Take 1 tablet by mouth once daily   ? omeprazole DR(+) (PRILOSEC) 20 mg capsule Take 20 mg by mouth daily.   ? pravastatin (PRAVACHOL) 40 mg tablet TAKE 1 TABLET BY MOUTH ONCE DAILY AT BEDTIME   ? spironolactone (ALDACTONE) 25 mg tablet Take one tablet by mouth daily. Take with food.   ? XARELTO 20 mg tablet TAKE 1 TABLET BY MOUTH ONCE DAILY WITH  DINNER     Vitals:    05/07/19 0914   BP: (P) 112/78   Pulse: 79   Resp: 16   Temp: 36.9 ?C (98.4 ?F)   TempSrc: Oral   SpO2: 98%   Weight: 90.2 kg (198 lb 12.8 oz)   Height: 185.4 cm (73)     Body mass index is 26.23 kg/m?Marland Kitchen     Physical Exam  Vitals signs and nursing note reviewed.   Constitutional:       General: He is not in acute distress.     Appearance: Normal appearance. He is well-developed and normal  weight. He is not ill-appearing, toxic-appearing or diaphoretic.   HENT:      Head: Normocephalic and atraumatic.      Right Ear: Tympanic membrane normal. Swelling present.      Left Ear: Tympanic membrane and ear canal normal.      Nose: Nose normal.      Mouth/Throat:      Lips: Pink.      Mouth: Mucous membranes are moist.      Pharynx: Uvula midline. Posterior oropharyngeal erythema present. No pharyngeal swelling, oropharyngeal exudate or uvula swelling.      Tonsils: 1+ on the right. 0 on the left.      Comments: Slight smoothing of the central tongue.  Throat is very red and tender.   Cardiovascular:      Rate and Rhythm: Normal rate and regular rhythm.      Heart sounds: Normal heart sounds.   Pulmonary:      Effort: Pulmonary effort is normal.      Breath sounds: Normal breath sounds.   Neurological: Mental Status: He is alert and oriented to person, place, and time.   Psychiatric:         Mood and Affect: Mood normal.         Behavior: Behavior normal.         Thought Content: Thought content normal.         Judgment: Judgment normal.       Differential diagnosis of thrush.  Pt has a hx of COPD and uses steroid inhalers.  Advised pt that if he does not improve on the amoxicillin for a bacterial pharyngitis, she should follow-up with PCP for further evaluation.  Pt stated understanding.        Assessment and Plan:  Aram Candela. Hariri was seen today for headache, sore throat and ear pain.    Diagnoses and all orders for this visit:    Acute otitis externa of right ear, unspecified type    Acute bacterial pharyngitis    Sore throat  -     POC RAPID STREP SCREEN  -     CULTURE-STREP SCREEN; Future; Expected date: 05/07/2019    Other orders  -     amoxicillin (AMOXIL) 500 mg capsule; Take one capsule by mouth every 12 hours for 10 days.  -     ofloxacin (FLOXIN) 0.3 % otic solution; Place five drops in affected ear(s) as directed twice daily for 10 days.      Patient Instructions     Pharyngitis: Strep (Presumed)    You have pharyngitis (sore throat). The healthcare staff may think your sore throat is caused by a bacteria called Group A streptococcus (strep).?This is often called strep throat. This is diagnosed by either a rapid strep test, which gives immediate results, or a throat culture, or both. Strep throat can cause throat pain that is worse when swallowing, aching all over, headache,?swollen lymph nodes or glands in the neck, and fever. The infection is contagious. It often spreads by coughing, kissing,?or touching others after touching your mouth or nose. An antibiotic is given to treat the infection. Antibiotics are prescribed by doctors to treat bacterial infections, not viral infections.   Home care  ? Rest at home. Drink plenty of fluids so you won?t get dehydrated. ? Stay home from work or school for the first 24 hours of taking the antibiotics, or as directed by the healthcare provider. After this time, you won't be contagious. You can then  return to work or school if you are feeling better, or as directed by the provider.?  ? Take the antibiotic medicine for the full 10 days, or as directed by the provider, even when you feel better or your symptoms improve. This is very important to make sure the infection is fully treated. It's also important to prevent medicine-resistant germs from growing. It's also the best way to prevent rheumatic fever, which can affect your heart and other parts of the body.?If you were given an antibiotic shot, your provider will tell you if more antibiotics are needed.  ? You may use acetaminophen or ibuprofen to control pain or fever, unless another medicine was prescribed for this. If you have chronic liver or kidney disease or ever had a stomach ulcer or gastrointestinal bleeding, talk with your provider before using these medicines.  ? Use throat lozenges or a throat-numbing spray to help reduce throat pain. Gargling with warm salt water can also help reduce throat pain. Dissolve 1/2 teaspoon of salt in 1 glass of warm water.?  ? Don?t eat salty or spicy foods. These can irritate the throat.    Follow-up care  Follow up with your healthcare provider or our staff if you don't feel better in 72 hours, or as directed.   When to get medical advice  Call your healthcare provider right away if any of these occur:   ? Fever of?100.4?F (38?C), or higher, or as directed by your provider  ? New or worse ear pain, sinus pain, or headache  ? Painful lumps in the back of neck  ? Stiff neck  ? Lymph nodes that get larger or neck swelling  ? Can?t open mouth wide due to throat pain  ? Signs of dehydration, such as very dark urine or no urine, sunken eyes, dizziness  ? Noisy breathing  ? Muffled voice  ? New rash  ? Symptoms are worse  ? New symptoms develop Call 911  Call 911 if you have any of these symptoms:   ? Can't swallow, especially saliva, with a lot of drooling  ? Trouble breathing or wheezing  ? Feeling faint  ? Can't talk  ? Feeling of doom  Prevention  Here are steps you can take to help prevent an infection:  ? Keep good handwashing habits.  ? Don?t have close contact with people who have sore throats, colds, or other upper respiratory infections.  ? Don?t smoke, and stay away from secondhand smoke.  ? Stay up to date with all of your vaccines.  [employer]Well last reviewed this educational content on 09/17/2018  ? 2000-2020 [employer], [place]. [address], [place], [place] [address]. All rights reserved. This information is not intended as a substitute for professional medical care. Always follow your healthcare professional's instructions.        External Ear Infection (Adult)    External otitis (also called ?swimmer?s ear?) is an infection in the ear canal. It is often caused by bacteria or fungus. It can occur a few days after water gets trapped in the ear canal (from swimming or bathing). It can also occur after cleaning too deeply in the ear canal with a cotton swab or other object. Sometimes, hair care products get into the ear canal and cause this problem.  Symptoms can include pain, fever, itching, redness, drainage, or swelling of the ear canal. Temporary hearing loss may also occur.  Home care  ? Do not try to clean the ear canal. This  can push pus and bacteria deeper into the canal.  ? Use prescribed ear drops as directed. These help reduce swelling and fight the infection. If an ear wick was placed in the ear canal, apply drops right onto the end of the wick. The wick will draw the medicine into the ear canal even if it is swollen closed.  ? A cotton ball may be loosely placed in the outer ear to absorb any drainage.  ? You may use acetaminophen or ibuprofen to control pain, unless another medicine?was prescribed. Note: If you have chronic liver or kidney disease or ever had a stomach ulcer or GI bleeding, talk to your healthcare provider before taking any of these medicines.  ? Do not allow water to get into your ear when bathing. Also, don't swim until the infection has cleared.  Prevention  ? Keep your ears dry. This helps lower the risk of infection. Dry your ears with a towel or hair dryer after getting wet. Also, use ear plugs when swimming.  ? Do not stick any objects in the ear to remove wax.  ? If you feel water trapped in your ear, use ear drops right away. You can get these drops over the counter at most drugstores. They work by removing water from the ear canal.  Follow-up care  Follow up with your healthcare provider in 1 week, or as advised.  When to seek medical advice  Call your healthcare provider right away if any of these occur:  ? Ear pain becomes worse or doesn?t improve after 3 days of treatment  ? Redness or swelling of the outer ear occurs or gets worse  ? Headache  ? Painful or stiff neck  ? Drowsiness or confusion  ? Fever of 100.4?F (38?C) or higher, or as directed by your healthcare provider  ? Seizure  StayWell last reviewed this educational content on 05/17/2016  ? 2000-2020 [employer], [place]. [address], [place], [place] [address]. All rights reserved. This information is not intended as a substitute for professional medical care. Always follow your healthcare professional's instructions.

## 2019-05-11 ENCOUNTER — Encounter: Admit: 2019-05-11 | Discharge: 2019-05-11 | Payer: BC Managed Care – HMO

## 2019-05-11 NOTE — Telephone Encounter
Ok- noted

## 2019-05-11 NOTE — Telephone Encounter
States he was seen in [facility] on Sunday - treated for strept - he wanted to check to see if his strept culture was back - did come back - was negative for strept - is on amoxicillin and ear drops for a possible strept infection and a right ear infection - did let him know his strept culture was negative. States he is feeling better - but was checking on culture.  Did let him know if he does not continue to improve that he needs to let us know - will do.  Wanted to make sure Dr. Jeronimo Greaves is aware of this - did let him know I will send her a message.

## 2019-05-22 NOTE — Progress Notes
Date of Service: 05/22/2019    Jonathan Barr is a 63 y.o. male. DOB: 16-Jan-1956   MRN#: [id number]    Subjective:       63 yo male went to urgent care on 05-07-19  Developed a sore throat in mid Sept-- some rt sided jaw pain and rt ear pain  Had a strept test which was neg- placed on amoxil and floxin ear drops  Here for f/u   His sxs are better  Still has a little tenderness which he pushes on his rt pinna  Reviewed hx in epic briefly- would like labs ordered- including a covid Ab test- would like a flu shot  Hx of htn- afib-COPD- GERD- hyperlipidemia           Review of Systems   Constitution: Negative for chills, diaphoresis and fever.   HENT: Positive for tinnitus.         Has a PND  Rare miinor nasal d/c    Cardiovascular: Positive for palpitations. Negative for chest pain.   Respiratory: Negative for cough.         Hx of COPD- always has SOB  Some DOE if working in his garage an forgot his combivent   Hematologic/Lymphatic: Bruises/bleeds easily.   Skin:        Bruises easily  His left leg looks a lot better than it did   Musculoskeletal:        Having some upper back pain and burning  occ rt knee pain   Gastrointestinal: Negative for abdominal pain, constipation, diarrhea, hematochezia, melena, nausea and vomiting.   Genitourinary: Negative for dysuria.        Urine stream can be slow at times   Neurological: Positive for headaches.         Objective:     ? acetaminophen SR(+) (TYLENOL) 650 mg tablet Take 650 mg by mouth every 6 hours as needed for Pain.   ? albuterol (VENTOLIN HFA, PROAIR HFA, PROVENTIL HFA) 90 mcg/actuation inhaler Inhale 2 Puffs by mouth into the lungs every 6 hours as needed for Wheezing or Shortness of Breath. Shake well before use.   ? ALPRAZolam (XANAX) 0.5 mg tablet Take one tablet by mouth three times daily as needed.   ? budesonide-formoteroL (SYMBICORT) 160-4.5 mcg/actuation inhalation Inhale two puffs by mouth into the lungs twice daily. ? dofetilide (TIKOSYN) 500 mcg capsule TAKE 1 CAPSULE BY MOUTH TWICE DAILY   ? ipratropium/albuterol (COMBIVENT RESPIMAT) 20-100 mcg/actuation inhaler Inhale one puff by mouth into the lungs four times daily.   ? L.ACID/L.CASEI/B.BIF/B.LON/FOS (PROBIOTIC BLEND PO) Take 1 tablet by mouth every 48 hours.   ? losartan (COZAAR) 50 mg tablet Take one tablet by mouth daily.   ? Magnesium Oxide 500 mg tab TWICE A DAY   ? metoprolol XL (TOPROL XL) 100 mg extended release tablet Take 1 tablet by mouth once daily   ? omeprazole DR(+) (PRILOSEC) 20 mg capsule Take 20 mg by mouth daily.   ? pravastatin (PRAVACHOL) 40 mg tablet TAKE 1 TABLET BY MOUTH ONCE DAILY AT BEDTIME   ? spironolactone (ALDACTONE) 25 mg tablet Take one tablet by mouth daily. Take with food.   ? XARELTO 20 mg tablet TAKE 1 TABLET BY MOUTH ONCE DAILY WITH  DINNER     Vitals:    05/22/19 1340   BP: 112/68   BP Source: Arm, Left Upper   Patient Position: Sitting   Pulse: 71   Resp: 20   Temp: 37 ?C (  98.6 ?F)   TempSrc: Oral   SpO2: 97%   Weight: 91.5 kg (201 lb 11.2 oz)   Height: 185.4 cm (73)   PainSc: Zero     Body mass index is 26.61 kg/m?Marland Kitchen     Physical Exam  Vitals signs and nursing note reviewed.   Constitutional:       Appearance: Normal appearance. He is well-developed.   HENT:      Head: Normocephalic and atraumatic.      Right Ear: Tympanic membrane and ear canal normal.      Left Ear: Tympanic membrane and ear canal normal.      Mouth/Throat:      Comments: Posterior pharynx clear  Eyes:      Conjunctiva/sclera: Conjunctivae normal.      Pupils: Pupils are equal, round, and reactive to light.   Neck:      Musculoskeletal: Neck supple.      Trachea: No tracheal deviation.   Cardiovascular:      Rate and Rhythm: Normal rate and regular rhythm.      Heart sounds: No murmur.   Pulmonary:      Effort: Pulmonary effort is normal.      Breath sounds: Normal breath sounds.   Abdominal:      General: Bowel sounds are normal. There is no distension. Palpations: Abdomen is soft. There is no mass.      Tenderness: There is no abdominal tenderness.   Musculoskeletal:      Right lower leg: No edema.      Left lower leg: No edema.   Lymphadenopathy:      Cervical: No cervical adenopathy.   Neurological:      General: No focal deficit present.      Mental Status: He is alert and oriented to person, place, and time.      Cranial Nerves: No cranial nerve deficit.   Psychiatric:         Mood and Affect: Mood normal.         Behavior: Behavior normal.         Thought Content: Thought content normal.         Judgment: Judgment normal.         A/p  Rt ear pain- better TM and canal look clear  Flu shot today  covid ab testing ordered   Also ordered f/u lab work- hx of htn- afib- hyperlipidemia  rtc for a PE

## 2019-05-22 NOTE — Patient Instructions
New shingles shot - shingrix

## 2019-06-22 ENCOUNTER — Ambulatory Visit: Admit: 2019-06-22 | Discharge: 2019-06-22 | Payer: BC Managed Care – HMO

## 2019-06-22 ENCOUNTER — Encounter: Admit: 2019-06-22 | Discharge: 2019-06-22 | Payer: BC Managed Care – HMO

## 2019-06-22 DIAGNOSIS — I10 Essential (primary) hypertension: Secondary | ICD-10-CM

## 2019-06-22 DIAGNOSIS — Z789 Other specified health status: Secondary | ICD-10-CM

## 2019-06-22 DIAGNOSIS — Z125 Encounter for screening for malignant neoplasm of prostate: Secondary | ICD-10-CM

## 2019-06-22 DIAGNOSIS — E785 Hyperlipidemia, unspecified: Secondary | ICD-10-CM

## 2019-06-22 DIAGNOSIS — I4891 Unspecified atrial fibrillation: Secondary | ICD-10-CM

## 2019-06-22 DIAGNOSIS — Z0000 Encounter for general adult medical examination without abnormal findings: Secondary | ICD-10-CM

## 2019-06-22 LAB — PSA SCREEN: Lab: 0.2 ng/mL — ABNORMAL HIGH (ref <4.01)

## 2019-06-22 LAB — THYROID STIMULATING HORMONE-TSH: Lab: 1.4 uU/mL (ref 0.35–5.00)

## 2019-06-22 LAB — COVID-19 (SARS-COV-2) ANTIBODY TOTAL

## 2019-06-22 LAB — CBC
Lab: 4.3 M/UL — ABNORMAL LOW (ref 4.4–5.5)
Lab: 7.3 K/UL (ref 4.5–11.0)

## 2019-06-22 LAB — COMPREHENSIVE METABOLIC PANEL
Lab: 139 MMOL/L — ABNORMAL LOW (ref 137–147)
Lab: 5.2 MMOL/L — ABNORMAL HIGH (ref 3.5–5.1)

## 2019-06-22 LAB — LIPID PROFILE
Lab: 105 mg/dL (ref <150)
Lab: 137 mg/dL — ABNORMAL HIGH (ref <100)
Lab: 148 mg/dL (ref 6.0–8.0)
Lab: 192 mg/dL (ref <200)

## 2019-06-23 ENCOUNTER — Encounter: Admit: 2019-06-23 | Discharge: 2019-06-23 | Payer: BC Managed Care – HMO

## 2019-06-23 DIAGNOSIS — E782 Mixed hyperlipidemia: Secondary | ICD-10-CM

## 2019-06-23 MED ORDER — PRAVASTATIN 80 MG PO TAB
80 mg | ORAL_TABLET | Freq: Every evening | ORAL | 3 refills | 90.00000 days | Status: AC
Start: 2019-06-23 — End: ?

## 2019-07-25 ENCOUNTER — Encounter: Admit: 2019-07-25 | Discharge: 2019-07-25 | Payer: BC Managed Care – HMO

## 2019-07-25 NOTE — Telephone Encounter
Patient called with concerns of a "skipping" feeling of his heart that began last night. Patient states he forgot to take his AM dose of medications yesterday. Patient reports mild chest discomfort and shortness of breath late last night and early this morning. At the time of the call the patient is not having any chest pain, shortness of breath, and the palpitations have lightened. Patient felt pulse and reports it feels regular. Plan is for him to come to the office to have an EKG and seek emergency medical care if palpitations worsen with chest pain and/or shortness of breath. Patient understands and agrees.     Will route to [facility] for his information.

## 2019-07-25 NOTE — Progress Notes
Patient came to office for an EKG after calling earlier complaining of palpitations.     Following is the documentation from that call: Patient called with concerns of a "skipping" feeling of his heart that began last night. Patient states he forgot to take his AM dose of medications yesterday. Patient reports mild chest discomfort and shortness of breath late last night and early this morning. At the time of the call the patient is not having any chest pain, shortness of breath, and the palpitations have lightened. Patient felt pulse and reports it feels regular. Plan is for him to come to the office to have an EKG and seek emergency medical care if palpitations worsen with chest pain and/or shortness of breath. Patient understands and agrees.     Upon arrival to office patient denies palpitations, chest pain or shortness of breath. EKG performed. EKG NSR.

## 2019-08-31 ENCOUNTER — Encounter: Admit: 2019-08-31 | Discharge: 2019-08-31 | Payer: No Typology Code available for payment source

## 2019-08-31 DIAGNOSIS — Z8679 Personal history of other diseases of the circulatory system: Secondary | ICD-10-CM

## 2019-08-31 DIAGNOSIS — E782 Mixed hyperlipidemia: Secondary | ICD-10-CM

## 2019-08-31 DIAGNOSIS — E785 Hyperlipidemia, unspecified: Secondary | ICD-10-CM

## 2019-08-31 DIAGNOSIS — I426 Alcoholic cardiomyopathy: Secondary | ICD-10-CM

## 2019-08-31 DIAGNOSIS — F419 Anxiety disorder, unspecified: Secondary | ICD-10-CM

## 2019-08-31 DIAGNOSIS — I10 Essential (primary) hypertension: Secondary | ICD-10-CM

## 2019-08-31 DIAGNOSIS — A0472 Enterocolitis due to Clostridium difficile, not specified as recurrent: Secondary | ICD-10-CM

## 2019-08-31 DIAGNOSIS — J449 Chronic obstructive pulmonary disease, unspecified: Secondary | ICD-10-CM

## 2019-08-31 DIAGNOSIS — I5022 Chronic systolic (congestive) heart failure: Secondary | ICD-10-CM

## 2019-08-31 DIAGNOSIS — I480 Paroxysmal atrial fibrillation: Secondary | ICD-10-CM

## 2019-08-31 DIAGNOSIS — R002 Palpitations: Secondary | ICD-10-CM

## 2019-08-31 DIAGNOSIS — I4891 Unspecified atrial fibrillation: Secondary | ICD-10-CM

## 2019-08-31 DIAGNOSIS — R9439 Abnormal result of other cardiovascular function study: Secondary | ICD-10-CM

## 2019-08-31 DIAGNOSIS — M549 Dorsalgia, unspecified: Secondary | ICD-10-CM

## 2019-08-31 DIAGNOSIS — I82409 Acute embolism and thrombosis of unspecified deep veins of unspecified lower extremity: Secondary | ICD-10-CM

## 2019-08-31 DIAGNOSIS — R198 Other specified symptoms and signs involving the digestive system and abdomen: Secondary | ICD-10-CM

## 2019-08-31 DIAGNOSIS — Z7901 Long term (current) use of anticoagulants: Secondary | ICD-10-CM

## 2019-08-31 DIAGNOSIS — Z86718 Personal history of other venous thrombosis and embolism: Secondary | ICD-10-CM

## 2019-08-31 DIAGNOSIS — R238 Other skin changes: Secondary | ICD-10-CM

## 2019-08-31 DIAGNOSIS — I2510 Atherosclerotic heart disease of native coronary artery without angina pectoris: Secondary | ICD-10-CM

## 2019-08-31 NOTE — Assessment & Plan Note
His blood pressure is fairly low on the current medical program but he is not lightheaded or fatigued so I did not make any changes in his medications.

## 2019-08-31 NOTE — Progress Notes
Date of Service: 08/31/2019    Jonathan Barr is a 64 y.o. male.       HPI     Jonathan Barr was in the [place] clinic today for follow-up regarding paroxysmal atrial fibrillation and cardiomyopathy.  He and his wife have settled in for the winter and he has been pretty good about avoiding any Covid exposure.    His weight is stable and he denies any problems with palpitations that would suggest recurrent atrial arrhythmias.  He tolerates oral anticoagulation without bleeding complications.  He denies any chest pain or breathlessness.  He has had no particular trouble with increased peripheral edema.         Vitals:    08/31/19 1354 08/31/19 1359   BP: 96/68 92/66   BP Source: Arm, Right Upper Arm, Left Upper   Patient Position: Sitting Sitting   Pulse: 91    Temp: 36.8 ?C (98.3 ?F)    TempSrc: Oral    SpO2: 96%    Weight: 97.1 kg (214 lb)    Height: 1.854 m (6' 1)    PainSc: Zero      Body mass index is 28.23 kg/m?Marland Kitchen     Past Medical History  Patient Active Problem List    Diagnosis Date Noted   ? Cellulitis of left leg 07/12/2018   ? AKI (acute kidney injury) (HCC) 07/12/2018   ? History of DVT (deep vein thrombosis) 07/12/2018   ? Chronic anticoagulation, on rivaroxiban 07/12/2018   ? Atrial fibrillation with rapid ventricular response (HCC) 07/11/2018   ? Sepsis (HCC) 07/11/2018   ? Traumatic hematoma of knee 05/31/2018   ? 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 Umbilical hernia 03/30/2016     Added automatically from request for surgery [id number]     ? Paroxysmal atrial fibrillation (HCC) 12/20/2015     02/22/01 Holter:  Predominant SR, Rare VPD's with bigeminy.  Rare non-sustained SVT.  Rare APD's    12/20/15 TEE with evidence of LA thrombus. Canceled DCCV. Digoxin loaded.  12/2015 - Successful TEE-cardioversion.  Started on Tikosyn.     ? COPD (chronic obstructive pulmonary disease) (HCC) 12/18/2015   ? Nocturnal oxygen desaturation - O2 use 12/18/2015   ? Colon polyp 02/07/2013     12/2012 - Colonoscopy ? GERD (gastroesophageal reflux disease) 02/07/2013     12/2012- EGD negative for Barrett's     ? Pulmonary nodule 02/07/2013     12/2012 - CT-chest 4.7 mm nodular opacity of peripheral left lower lobe.  F/U CT recommended in 6 months.     ? Essential hypertension 04/24/2011   ? History of tobacco use, quit in 2012 04/24/2011   ? History of alcoholic cardiomyopathy      EF  48%  per stress echo 02/29/00.    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  Normal study.     ? Anxiety    ? Chest pain 09/24/2006        a. Stress Echo 02/29/00 with mild resting global LV asyneresis. Induced inf and post wall myocardial ischemia      b. 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      c.  Cardiac cath 2/08 - Normal coronaries, normal LV function       ? Hyperlipidemia          Review of  Systems   Constitution: Negative.   HENT: Negative.    Eyes: Negative.    Cardiovascular: Negative.    Respiratory: Negative.    Endocrine: Negative.    Hematologic/Lymphatic: Negative.    Skin: Negative.    Musculoskeletal: Negative.    Gastrointestinal: Negative.    Genitourinary: Negative.    Neurological: Negative.    Psychiatric/Behavioral: Negative.    Allergic/Immunologic: Negative.        Physical Exam    Physical Exam   General Appearance: no distress   Skin: warm, no ulcers or xanthomas   Digits and Nails: no cyanosis or clubbing   Eyes: conjunctivae and lids normal, pupils are equal and round   Teeth/Gums/Palate: dentition unremarkable, no lesions   Lips & Oral Mucosa: no pallor or cyanosis   Neck Veins: normal JVP , neck veins are not distended   Thyroid: no nodules, masses, tenderness or enlargement   Chest Inspection: chest is normal in appearance   Respiratory Effort: breathing comfortably, no respiratory distress Auscultation/Percussion: lungs clear to auscultation, no rales or rhonchi, no wheezing   PMI: PMI not enlarged or displaced   Cardiac Rhythm: regular rhythm and normal rate   Cardiac Auscultation: S1, S2 normal, no rub, no gallop   Murmurs: no murmur   Peripheral Circulation: normal peripheral circulation   Carotid Arteries: normal carotid upstroke bilaterally, no bruits   Radial Arteries: normal symmetric radial pulses   Abdominal Aorta: no abdominal aortic bruit   Pedal Pulses: normal symmetric pedal pulses   Lower Extremity Edema: no lower extremity edema   Abdominal Exam: soft, non-tender, no masses, bowel sounds normal   Liver & Spleen: no organomegaly   Gait & Station: walks without assistance   Muscle Strength: normal muscle tone   Orientation: oriented to time, place and person   Affect & Mood: appropriate and sustained affect   Language and Memory: patient responsive and seems to comprehend information   Neurologic Exam: neurological assessment grossly intact   Other: moves all extremities      Cardiovascular Studies    EKG:  Sinus rhythm, rate 63.  QT 430 msec.    Problems Addressed Today  Encounter Diagnoses   Name Primary?   ? Essential hypertension    ? Mixed hyperlipidemia    ? Paroxysmal atrial fibrillation (HCC)    ? History of alcoholic cardiomyopathy        Assessment and Plan       Essential hypertension  His blood pressure is fairly low on the current medical program but he is not lightheaded or fatigued so I did not make any changes in his medications.    Hyperlipidemia  Lab Results   Component Value Date    CHOL 192 06/22/2019    TRIG 105 06/22/2019    HDL 44 06/22/2019    LDL 137 (H) 06/22/2019    VLDL 21 06/22/2019    NONHDLCHOL 148 06/22/2019    CHOLHDLC 4.0 11/19/2017      His pravastatin dosage was increased after this lipid profile.    Paroxysmal atrial fibrillation (HCC) He has had no complications related to oral anticoagulation.  Today's EKG shows an acceptable QT interval on dofetilide.    History of alcoholic cardiomyopathy  He is not completely abstinent but I believe that he is drinking alcohol pretty minimally.      Current Medications (including today's revisions)  ? acetaminophen SR(+) (TYLENOL) 650 mg tablet Take 650 mg by mouth every 6 hours as needed for Pain.   ?  albuterol (VENTOLIN HFA, PROAIR HFA, PROVENTIL HFA) 90 mcg/actuation inhaler Inhale 2 Puffs by mouth into the lungs every 6 hours as needed for Wheezing or Shortness of Breath. Shake well before use.   ? ALPRAZolam (XANAX) 0.5 mg tablet Take one tablet by mouth three times daily as needed.   ? budesonide-formoteroL (SYMBICORT) 160-4.5 mcg/actuation inhalation Inhale two puffs by mouth into the lungs twice daily.   ? dofetilide (TIKOSYN) 500 mcg capsule TAKE 1 CAPSULE BY MOUTH TWICE DAILY   ? ipratropium/albuterol (COMBIVENT RESPIMAT) 20-100 mcg/actuation inhaler Inhale one puff by mouth into the lungs four times daily.   ? L.ACID/L.CASEI/B.BIF/B.LON/FOS (PROBIOTIC BLEND PO) Take 1 tablet by mouth every 48 hours.   ? losartan (COZAAR) 50 mg tablet Take one tablet by mouth daily.   ? Magnesium Oxide 500 mg tab TWICE A DAY   ? metoprolol XL (TOPROL XL) 100 mg extended release tablet Take 1 tablet by mouth once daily   ? omeprazole DR(+) (PRILOSEC) 20 mg capsule Take 20 mg by mouth daily.   ? pravastatin (PRAVACHOL) 80 mg tablet Take one tablet by mouth at bedtime daily.   ? spironolactone (ALDACTONE) 25 mg tablet Take one tablet by mouth daily. Take with food.   ? XARELTO 20 mg tablet TAKE 1 TABLET BY MOUTH ONCE DAILY WITH  DINNER Total time spent on today's office visit was 30 minutes.  This includes face-to-face in person visit with patient as well as nonface-to-face time including review of the EMR, outside records, labs, radiologic studies, echocardiogram & other cardiovascular studies, formation of treatment plan, after visit summary, future disposition, and lastly on documentation.

## 2019-08-31 NOTE — Assessment & Plan Note
He has had no complications related to oral anticoagulation.  Today's EKG shows an acceptable QT interval on dofetilide.

## 2019-08-31 NOTE — Assessment & Plan Note
Lab Results   Component Value Date    CHOL 192 06/22/2019    TRIG 105 06/22/2019    HDL 44 06/22/2019    LDL 137 (H) 06/22/2019    VLDL 21 06/22/2019    NONHDLCHOL 148 06/22/2019    CHOLHDLC 4.0 11/19/2017      His pravastatin dosage was increased after this lipid profile.

## 2019-08-31 NOTE — Assessment & Plan Note
He is not completely abstinent but I believe that he is drinking alcohol pretty minimally.

## 2019-09-05 ENCOUNTER — Encounter: Admit: 2019-09-05 | Discharge: 2019-09-05 | Payer: No Typology Code available for payment source

## 2019-09-05 DIAGNOSIS — K219 Gastro-esophageal reflux disease without esophagitis: Secondary | ICD-10-CM

## 2019-09-05 DIAGNOSIS — I5022 Chronic systolic (congestive) heart failure: Secondary | ICD-10-CM

## 2019-09-05 DIAGNOSIS — G4734 Idiopathic sleep related nonobstructive alveolar hypoventilation: Secondary | ICD-10-CM

## 2019-09-05 DIAGNOSIS — I513 Intracardiac thrombosis, not elsewhere classified: Secondary | ICD-10-CM

## 2019-09-05 DIAGNOSIS — I10 Essential (primary) hypertension: Secondary | ICD-10-CM

## 2019-09-05 DIAGNOSIS — E7800 Pure hypercholesterolemia, unspecified: Secondary | ICD-10-CM

## 2019-09-05 DIAGNOSIS — I4819 Other persistent atrial fibrillation: Secondary | ICD-10-CM

## 2019-09-05 DIAGNOSIS — J449 Chronic obstructive pulmonary disease, unspecified: Secondary | ICD-10-CM

## 2019-09-05 MED ORDER — SPIRONOLACTONE 25 MG PO TAB
ORAL_TABLET | Freq: Every day | ORAL | 11 refills | 90.00000 days | Status: DC
Start: 2019-09-05 — End: 2019-10-09

## 2019-09-05 MED ORDER — DOFETILIDE 500 MCG PO CAP
ORAL_CAPSULE | Freq: Two times a day (BID) | 3 refills | Status: AC
Start: 2019-09-05 — End: ?

## 2019-09-05 MED ORDER — PRAVASTATIN 40 MG PO TAB
ORAL_TABLET | Freq: Every day | ORAL | 11 refills | 90.00000 days | Status: DC
Start: 2019-09-05 — End: 2020-01-23

## 2019-09-05 MED ORDER — XARELTO 20 MG PO TAB
ORAL_TABLET | Freq: Every day | ORAL | 11 refills | 30.00000 days | Status: AC
Start: 2019-09-05 — End: ?

## 2019-09-15 ENCOUNTER — Encounter: Admit: 2019-09-15 | Discharge: 2019-09-15 | Payer: No Typology Code available for payment source

## 2019-09-15 DIAGNOSIS — R079 Chest pain, unspecified: Secondary | ICD-10-CM

## 2019-09-15 DIAGNOSIS — F419 Anxiety disorder, unspecified: Secondary | ICD-10-CM

## 2019-09-15 MED ORDER — SYMBICORT 160-4.5 MCG/ACTUATION IN HFAA
Freq: Two times a day (BID) | RESPIRATORY_TRACT | 0 refills | 30.00000 days | Status: DC
Start: 2019-09-15 — End: 2019-10-10

## 2019-09-15 MED ORDER — ALPRAZOLAM 0.5 MG PO TAB
.5 mg | ORAL_TABLET | Freq: Every evening | ORAL | 5 refills | Status: AC | PRN
Start: 2019-09-15 — End: ?

## 2019-09-19 ENCOUNTER — Encounter: Admit: 2019-09-19 | Discharge: 2019-09-19 | Payer: No Typology Code available for payment source

## 2019-09-19 DIAGNOSIS — F419 Anxiety disorder, unspecified: Secondary | ICD-10-CM

## 2019-09-19 DIAGNOSIS — R079 Chest pain, unspecified: Secondary | ICD-10-CM

## 2019-09-19 MED ORDER — ALPRAZOLAM 0.5 MG PO TAB
ORAL_TABLET | Freq: Three times a day (TID) | 0 refills | PRN
Start: 2019-09-19 — End: ?

## 2019-09-27 ENCOUNTER — Encounter: Admit: 2019-09-27 | Discharge: 2019-09-27 | Payer: No Typology Code available for payment source

## 2019-09-27 NOTE — Telephone Encounter
Pt called and left message on nursing triage line stating that since his PCP, Dr. Reita Cliche increased his pravastatin to 80mg , he has been having issues.  He has been feeling lightheaded and dizzy in the morning, worse today, but has noticed it a little over the past week or so.  His blood pressure this morning was 130/89, HR 84 before he took his medication.  About an hour after taking his medication is blood pressure was 132/80, heart rate 74.  Pt also reports that he is going through a very stressful situation as his son was recently killed.    He denies any muscle aches/cramps.  He states that he did have those when he first increased his dose of pravastatin, but they have gone away.  He denies any chest pain or shortness of breath, but did have some palpitations in the evening last night or the night before.  He denies any syncopal episodes.     Verified medication with patient.  In the morning, he is taking Tikosyn , Losartan 50mg , Metoprolol XL 100mg , and spironolactone 25mg .      Will route to Dr. for any additional recommendations

## 2019-10-09 ENCOUNTER — Encounter: Admit: 2019-10-09 | Discharge: 2019-10-09 | Payer: No Typology Code available for payment source

## 2019-10-09 MED ORDER — BUDESONIDE-FORMOTEROL 160-4.5 MCG/ACTUATION IN HFAA
2 | Freq: Two times a day (BID) | RESPIRATORY_TRACT | 11 refills | 30.00000 days | Status: DC
Start: 2019-10-09 — End: 2019-10-31

## 2019-10-09 MED ORDER — SPIRONOLACTONE 25 MG PO TAB
ORAL_TABLET | Freq: Every day | ORAL | 0 refills | 90.00000 days | Status: DC
Start: 2019-10-09 — End: 2019-11-06

## 2019-10-09 NOTE — Telephone Encounter
LOV 05/22/19 Ear pain  LF 09/15/19

## 2019-10-15 ENCOUNTER — Encounter: Admit: 2019-10-15 | Discharge: 2019-10-15 | Payer: No Typology Code available for payment source

## 2019-10-15 MED ORDER — COMBIVENT RESPIMAT 20-100 MCG/ACTUATION IN MIST
Freq: Four times a day (QID) | RESPIRATORY_TRACT | 1 refills | 30.00000 days | Status: AC
Start: 2019-10-15 — End: ?

## 2019-10-20 ENCOUNTER — Encounter: Admit: 2019-10-20 | Discharge: 2019-10-20 | Payer: No Typology Code available for payment source

## 2019-10-20 NOTE — Telephone Encounter
yes

## 2019-10-20 NOTE — Telephone Encounter
Has ventolin HFA at home, also symbicort 160-4.5 also. Can not afford combivent respimat.  Can he use the first two inhalers instead of the combivent?  And next month can budget in.

## 2019-10-23 ENCOUNTER — Encounter: Admit: 2019-10-23 | Discharge: 2019-10-23 | Payer: No Typology Code available for payment source

## 2019-10-23 NOTE — Telephone Encounter
Jonathan Barr called the nursing triage line and states that his Xarelto and Tikosyn are expensive.  He states he received a letter in the mail regarding his symbicort that states that the provider can do a tier exemption to lower the cost.  He was wondering if that was a possibility for his Xarelto and Tikosyn.  He states that they are expensive. He believes the co-pay for xarelto is around $50.00 and the Tikosyn is around $40.00.      [employer] ([phone]) and discussed tier exemptions with the representative.  She was able to process a tier exemption for both medications  The xarelto is a tier 3 and the exemption was denied due to brand name and no alternatives.  The Tikosyn is currently a Tier 4 and the exemption is pending pharmacist determination, which will be received within 72 hours.    Called and discussed with Jonathan Barr.  He states that he is spending over $200/month in prescriptions.  We discussed other medications, mostly warfarin as a possible cheaper alternative.  Jonathan Barr states that he would rather stay on the Xarelto and figure out how to pay for it.  I sent message to the retail medication assistance team to see if there were any cost saving options for Jonathan Barr for the Xarelto.      No further needs identified.  Pt will callback with any additional questions, concerns or problems.

## 2019-10-24 ENCOUNTER — Encounter: Admit: 2019-10-24 | Discharge: 2019-10-24 | Payer: No Typology Code available for payment source

## 2019-10-24 NOTE — Progress Notes
PA submitted on cover my meds for budesonide-formoterol - pending clinical review.

## 2019-10-26 ENCOUNTER — Encounter: Admit: 2019-10-26 | Discharge: 2019-10-26 | Payer: No Typology Code available for payment source

## 2019-10-26 NOTE — Progress Notes
Medication Assistance Program packet has been sent to patient for signatures and other required documents (if applicable) . Once received back, medication assistance coordinator will begin processing     Jonathan Barr  Medication Assistance Coordinator   [phone]

## 2019-10-31 ENCOUNTER — Encounter: Admit: 2019-10-31 | Discharge: 2019-10-31 | Payer: No Typology Code available for payment source

## 2019-10-31 MED ORDER — FLUTICASONE PROPION-SALMETEROL 100-50 MCG/DOSE IN DSDV
1 | Freq: Two times a day (BID) | RESPIRATORY_TRACT | 1 refills | Status: DC
Start: 2019-10-31 — End: 2019-11-06

## 2019-11-01 ENCOUNTER — Encounter: Admit: 2019-11-01 | Discharge: 2019-11-01 | Payer: No Typology Code available for payment source

## 2019-11-02 ENCOUNTER — Encounter: Admit: 2019-11-02 | Discharge: 2019-11-02 | Payer: No Typology Code available for payment source

## 2019-11-02 NOTE — Telephone Encounter
If you still have the ihaler paper - please give it back and will choose something else for him- thanks

## 2019-11-02 NOTE — Telephone Encounter
Patient called stating he thinks he has tried Wixela in the past and it's a "powdery substance" and would prefer one that is a aerosol inhaler instead. Routed to Dr Reita Cliche to advise.

## 2019-11-03 ENCOUNTER — Encounter: Admit: 2019-11-03 | Discharge: 2019-11-03 | Payer: No Typology Code available for payment source

## 2019-11-03 MED ORDER — SYMBICORT 160-4.5 MCG/ACTUATION IN HFAA
Freq: Two times a day (BID) | 0 refills
Start: 2019-11-03 — End: ?

## 2019-11-03 NOTE — Telephone Encounter
The inhalers on the list sent by the insurer are either not in the same class as symbicort or are dry powder- please have him just try the wixela and see how it works for him- have the pharmacist show him how to use it and have him rinse his mouth after using it- the cash price on symbicort is $200-300 per inhaler depending on which pharmacy he goes to - and that is with a Good Rx coupon- so he really needs to try the [facility]

## 2019-11-04 ENCOUNTER — Encounter: Admit: 2019-11-04 | Discharge: 2019-11-04 | Payer: No Typology Code available for payment source

## 2019-11-06 MED ORDER — SPIRONOLACTONE 25 MG PO TAB
ORAL_TABLET | Freq: Every day | ORAL | 11 refills | 90.00000 days | Status: DC
Start: 2019-11-06 — End: 2020-02-08

## 2019-11-06 MED ORDER — BUDESONIDE-FORMOTEROL 160-4.5 MCG/ACTUATION IN HFAA
2 | Freq: Two times a day (BID) | RESPIRATORY_TRACT | 3 refills | 30.00000 days | Status: AC
Start: 2019-11-06 — End: ?

## 2019-11-21 ENCOUNTER — Encounter: Admit: 2019-11-21 | Discharge: 2019-11-21 | Payer: No Typology Code available for payment source

## 2019-11-21 DIAGNOSIS — E7800 Pure hypercholesterolemia, unspecified: Secondary | ICD-10-CM

## 2019-11-21 DIAGNOSIS — I513 Intracardiac thrombosis, not elsewhere classified: Secondary | ICD-10-CM

## 2019-11-21 DIAGNOSIS — I4819 Other persistent atrial fibrillation: Secondary | ICD-10-CM

## 2019-11-21 DIAGNOSIS — I10 Essential (primary) hypertension: Secondary | ICD-10-CM

## 2019-11-21 DIAGNOSIS — J449 Chronic obstructive pulmonary disease, unspecified: Secondary | ICD-10-CM

## 2019-11-21 DIAGNOSIS — K219 Gastro-esophageal reflux disease without esophagitis: Secondary | ICD-10-CM

## 2019-11-21 DIAGNOSIS — G4734 Idiopathic sleep related nonobstructive alveolar hypoventilation: Secondary | ICD-10-CM

## 2019-11-21 DIAGNOSIS — I5022 Chronic systolic (congestive) heart failure: Secondary | ICD-10-CM

## 2019-11-21 MED ORDER — METOPROLOL SUCCINATE 100 MG PO TB24
ORAL_TABLET | Freq: Every day | ORAL | 2 refills | 90.00000 days | Status: DC
Start: 2019-11-21 — End: 2020-02-15

## 2019-11-30 ENCOUNTER — Encounter: Admit: 2019-11-30 | Discharge: 2019-11-30 | Payer: No Typology Code available for payment source

## 2019-11-30 NOTE — Telephone Encounter
Patient called this morning with concerns of "heart fluttering" with shortness of breath. Call returned to patient with no answer and left a vm with call back number. Attempted to call patient back after 30 minutes and patient answered. Patient states he had an episode this morning of feeling like his heart was racing, light headed and increased shortness of breath. Patient states he has anxiety so he took 2 xanex with his other medications at that time and is feeling better now. Denies the symptoms at this time. Offered for patient to come to office for nurse visit with EKG and he declined. Gave patient nursing line phone number to call if his symptoms return and he changes his mind. Also discussed when to seek emergency care.     Will route to Dr. Barry Dienes for his review.

## 2020-01-11 ENCOUNTER — Encounter: Admit: 2020-01-11 | Discharge: 2020-01-11 | Payer: No Typology Code available for payment source

## 2020-01-11 NOTE — Telephone Encounter
Returned pt's call regarding BP readings and symptoms. Pt states his BP's run any where from 89-115/64-75 and HR anywhere from 64-72. Pt states that since his last OV, he has had a few episodes where he felt cool, clammy, light headed, and dizzy. During one of these episodes he reports taking his BP and it read 89/64. He states these episodes don't happen very often and don't last longer than a few minutes and they go away on their own. He reports sometimes he has difficulty doing activities outside, but also attributes this to his COPD. Pt states overall he's feeling okay he was more worried about his low BP readings.     Will route to [facility] for further recommendations.

## 2020-01-11 NOTE — Telephone Encounter
Called pt and discussed Dr. Maisie Fus' recommendations. Pt is agreeable. Pt will be set up for a nurses visit Friday afternoon to check EKG rhythm and BP readings. Pt is agreeable and will stop medications for 24hrs. Pt is to bring home BP cuff to compare with clinic and his home BP readings to nurse visit. Pt denies any fever related symptoms. Pt will call with any questions or concerns.    Dr. Maisie Fus has reviewed pt chart. He would like to do the following:   1. Hold Losartan and Aldactone for 24 hours.   2. Have him call tomorrow afternoon with BP readings. If SBP is > than 140 restart Losartan 50 mg.   3. Assess for infection, does he have a fever or current infections.   4. Is he in Afib?     Per Dr. Maisie Fus   Thank you   Selena Batten RN

## 2020-01-12 ENCOUNTER — Encounter: Admit: 2020-01-12 | Discharge: 2020-01-12 | Payer: MEDICARE

## 2020-01-12 DIAGNOSIS — I480 Paroxysmal atrial fibrillation: Secondary | ICD-10-CM

## 2020-01-12 NOTE — Progress Notes
Pt in clinic today for BP check and EKG after calling in yesterday with symptoms of dizziness, clamminess, and hypotension.    His EKG showed SR, reviewed by Dr. Sandria Manly in clinic.  BP today in office were: 136/86, 125/81, 133/91, HR 70.    BP readings from home are:   5/27 - 83/51, 90/63, 101/65  5/28- 128/87, 108/77, 103/66  Heart rate in the 60s.    Pt reports that he is feeling okay today.  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  He sometimes develops chest pain that is located mid-sternally, on the left side without radiation.  He denies any worsening shortness of breath or nausea, but does feel clammy/diaphretic.  He also occasionally has left sided neck pain.  He states that these just happen occasionally and do not last very long.      He is taking medications as prescribed.  Pt states that he held his losartan and spironolactone yesterday as directed.      Discussed with Dr. Sandria Manly in clinic. He recommended continuing to hold spironolactone, and restart losartan at 25mg  daily.  Pt is agreeable to plan. Beverley will continue to monitor his BP and call us with his readings next week.

## 2020-01-18 ENCOUNTER — Encounter: Admit: 2020-01-18 | Discharge: 2020-01-18 | Payer: MEDICARE

## 2020-01-18 NOTE — Telephone Encounter
01/18/2020 3:46 PM   Patient called stating he can no longer afford his xarelto. He has been paying $47 a month and today when he tried to pick it up the cost was $78 per month. He is contacting other pharmacies to see if he can get a lower price and is planning to talk to [employer] on Monday. A pharmacy referral request was completed for see if he qualifies for financial assitance. He currently has a 7 day supply of xarelto.   Colvin Caroli, LPN

## 2020-01-19 ENCOUNTER — Encounter: Admit: 2020-01-19 | Discharge: 2020-01-19 | Payer: MEDICARE

## 2020-01-19 NOTE — Progress Notes
Medication Assistance Program packet has been sent to patient for signatures and other required documents (if applicable) . Once received back, medication assistance coordinator will begin processing       Jonathan Barr  Medication Assitance Coordinator  [phone]

## 2020-01-23 ENCOUNTER — Ambulatory Visit: Admit: 2020-01-23 | Discharge: 2020-01-23 | Payer: MEDICARE

## 2020-01-23 ENCOUNTER — Encounter: Admit: 2020-01-23 | Discharge: 2020-01-23 | Payer: MEDICARE

## 2020-01-23 DIAGNOSIS — A0472 Enterocolitis due to Clostridium difficile, not specified as recurrent: Secondary | ICD-10-CM

## 2020-01-23 DIAGNOSIS — I2510 Atherosclerotic heart disease of native coronary artery without angina pectoris: Secondary | ICD-10-CM

## 2020-01-23 DIAGNOSIS — I4891 Unspecified atrial fibrillation: Secondary | ICD-10-CM

## 2020-01-23 DIAGNOSIS — Z8679 Personal history of other diseases of the circulatory system: Secondary | ICD-10-CM

## 2020-01-23 DIAGNOSIS — J449 Chronic obstructive pulmonary disease, unspecified: Secondary | ICD-10-CM

## 2020-01-23 DIAGNOSIS — I82409 Acute embolism and thrombosis of unspecified deep veins of unspecified lower extremity: Secondary | ICD-10-CM

## 2020-01-23 DIAGNOSIS — E785 Hyperlipidemia, unspecified: Secondary | ICD-10-CM

## 2020-01-23 DIAGNOSIS — I10 Essential (primary) hypertension: Secondary | ICD-10-CM

## 2020-01-23 DIAGNOSIS — Z86718 Personal history of other venous thrombosis and embolism: Secondary | ICD-10-CM

## 2020-01-23 DIAGNOSIS — N5089 Other specified disorders of the male genital organs: Secondary | ICD-10-CM

## 2020-01-23 DIAGNOSIS — Z7901 Long term (current) use of anticoagulants: Secondary | ICD-10-CM

## 2020-01-23 DIAGNOSIS — Z0000 Encounter for general adult medical examination without abnormal findings: Secondary | ICD-10-CM

## 2020-01-23 DIAGNOSIS — R9439 Abnormal result of other cardiovascular function study: Secondary | ICD-10-CM

## 2020-01-23 DIAGNOSIS — F419 Anxiety disorder, unspecified: Secondary | ICD-10-CM

## 2020-01-23 DIAGNOSIS — R238 Other skin changes: Secondary | ICD-10-CM

## 2020-01-23 DIAGNOSIS — R002 Palpitations: Secondary | ICD-10-CM

## 2020-01-23 DIAGNOSIS — I426 Alcoholic cardiomyopathy: Secondary | ICD-10-CM

## 2020-01-23 DIAGNOSIS — R198 Other specified symptoms and signs involving the digestive system and abdomen: Secondary | ICD-10-CM

## 2020-01-23 DIAGNOSIS — M549 Dorsalgia, unspecified: Secondary | ICD-10-CM

## 2020-01-23 DIAGNOSIS — I5022 Chronic systolic (congestive) heart failure: Secondary | ICD-10-CM

## 2020-01-23 NOTE — Progress Notes
Date of Service: 01/23/2020    Jonathan Barr is a 64 y.o. male. DOB: [birth year]   MRN#: [id number]    Subjective:       64 yo male here for annual wellness visit  Hx of afib- htn-GERD- hyperlipidemia-COPD- hept c- anxiety  Reviewed hx in epic briefly  Reviewed HRA  Health Risk Assessment Questionnaire  Current Care  List of Providers you have seen in the last two years: Dr Danella Maiers - Cardiology, Dr Cathi Roan - PCP  Are you receiving home health?: No  During the past 4 weeks, how would you rate your health in general?: Good    Outside Care  Since your last PCP visit, have you received care outside of [facility]?: No    Physical Activity  Do you exercise or are you physically active?: Yes  How many days a week do you usually exercise or are physically active?: 4  On days when you exercised or were physically active, how many minutes was the activity?: 180  During the past four weeks, what was the hardest physical activity you could do for at least two minutes?: Moderate    Diet  In the past month, were you worried whether your food would run out before you or your family had money to buy more?: (!) Yes  In the past 7 days, how many times did you eat fast food or junk food or pizza?: 1  In the past 7 days, how many servings of fruits or vegetables did you eat each day?: (!) 0-1  In the past 7 days, how many sodas and sugar sweetened drinks (regular, not diet) did you drink each day?: (!) 2    Smoke/Tobacco Use  Are you currently a smoker?: No    Alcohol Use  Do you drink alcohol?: Yes  Are you Male or Male?: Male  Male: In the last three months, have you had >3 alcoholic beverages in any one day or >14 in any one week?: No    Depression Screen  Little interest or pleasure in doing things: Not at All  Feeling down, depressed or hopeless: Not at All    Pain  How would you rate your pain today?: Very mild pain    Ambulation  Do you use any assistive devices for ambulation?: No    Fall Risk  Does it take you longer than 30 seconds to get up and out of a chair?: No  Have you fallen in the past year?: No    Motor Vehicle Safety  Do you fasten your seat belt when you are in the car?: Yes    Sun Exposure  Do you protect yourself from the sun? For example, wear sunscreen when outside.: Yes    Hearing Loss  Do you have trouble hearing the television or radio when others do not?: (!) Yes   Do you have to strain or struggle to hear/understand conversation?: (!) Yes  Do you use hearing aids?: No    Cognitive Impairment  During the past 12 months, have you experienced confusion or memory loss that is happening more often or is getting worse?: No    Functional Screen  Do you live alone?: Yes  Do you live at: Home  Can you drive your own car or travel alone by bus or taxi?: Yes  Can you shop for groceries or clothes without help?: Yes  Can you prepare your own meals?: Yes  Can you do your  own housework without help?: Yes  Can you handle your own money without help?: Yes  Do you need help eating, bathing, dressing, or getting around your home?: No  Do you feel safe?: Yes  Does anyone at home hurt you, hit you, or threaten you?: No  Have you ever been the victim of abuse?: No    Home Safety  Does your home have grab bars in the bathroom?: (!) No  Does your home have hand rails on stairs and steps?: Yes  Does your home have functioning smoke alarms?: Yes    Advance Directive  Do you have a living will or Advance Directive?: (!) No  Are you interested in discussing the importance of a living will or Advance Directive?: No    Dental Screen  Have you had an exam by your dentist in the last year?: (!) No    Vision Screen  Do you have diabetes?: No           Review of Systems   Constitution: Negative for chills and fever.        Occ sweating    HENT: Positive for tinnitus.         No recent URIs  Thinks he can hear ok except for the tinnitus  Has upper dentures and a partial on the bottom   Eyes:        Has glasses-has floaters in the rt eye   Just went to the eye doc   Cardiovascular: Negative for chest pain.        Occ ankle swelling- depending on salt intake   Respiratory: Positive for cough, shortness of breath and wheezing.         Hx of COPD   Hematologic/Lymphatic: Bruises/bleeds easily.   Skin:        Bruises easily   Musculoskeletal:        Has aches and pains- I am old!   Gastrointestinal: Positive for diarrhea. Negative for abdominal pain, constipation, hematochezia, melena, nausea and vomiting.        1 testicle is larger than the other  Has occ GERD   Genitourinary: Negative for dysuria and hematuria.        Has nocturia 3-4 x per night   Neurological: Positive for headaches.   Psychiatric/Behavioral:        Does not sleep well  Some stress- having trouble affording his tier 3 meds  Has anxiety-no panic attacks in a yr   Allergic/Immunologic: Positive for environmental allergies.         Objective:     ? acetaminophen SR(+) (TYLENOL) 650 mg tablet Take 650 mg by mouth every 6 hours as needed for Pain.   ? albuterol (VENTOLIN HFA, PROAIR HFA, PROVENTIL HFA) 90 mcg/actuation inhaler Inhale 2 Puffs by mouth into the lungs every 6 hours as needed for Wheezing or Shortness of Breath. Shake well before use.   ? ALPRAZolam (XANAX) 0.5 mg tablet Take one tablet by mouth at bedtime as needed for Anxiety.   ? budesonide-formoterol (SYMBICORT HFA) 160-4.5 mcg/actuation inhalation Inhale two puffs by mouth into the lungs twice daily.   ? COMBIVENT RESPIMAT 20-100 mcg/actuation inhaler INHALE 1 PUFF BY MOUTH 4 TIMES DAILY   ? dofetilide (TIKOSYN) 500 mcg capsule Take 1 capsule by mouth twice daily   ? L.ACID/L.CASEI/B.BIF/B.LON/FOS (PROBIOTIC BLEND PO) Take 1 tablet by mouth every 48 hours.   ? losartan (COZAAR) 50 mg tablet Take one tablet by mouth daily. (Patient taking differently: Take  25 mg by mouth daily.)   ? Magnesium Oxide 500 mg tab TWICE A DAY   ? metoprolol XL (TOPROL XL) 100 mg extended release tablet Take 1 tablet by mouth once daily   ? omeprazole DR(+) (PRILOSEC) 20 mg capsule Take 20 mg by mouth daily.   ? pravastatin (PRAVACHOL) 40 mg tablet TAKE 1 TABLET BY MOUTH ONCE DAILY AT BEDTIME   ? pravastatin (PRAVACHOL) 80 mg tablet Take one tablet by mouth at bedtime daily.   ? spironolactone (ALDACTONE) 25 mg tablet Take 1 tablet by mouth once daily with food   ? XARELTO 20 mg tablet TAKE 1 TABLET BY MOUTH ONCE DAILY WITH DINNER     Vitals:    01/23/20 1441   BP: 96/66   BP Source: Arm, Left Upper   Patient Position: Sitting   Pulse: 75   Temp: 36.7 ?C (98 ?F)   TempSrc: Temporal   SpO2: 98%   Weight: 95.3 kg (210 lb)   Height: 180.3 cm (71)   PainSc: Zero     Body mass index is 29.29 kg/m?Marland Kitchen     Physical Exam  Vitals and nursing note reviewed.   Constitutional:       General: He is not in acute distress.     Appearance: Normal appearance. He is well-developed. He is not ill-appearing.      Comments: bp was 120/70 on recheck   HENT:      Head: Normocephalic and atraumatic.      Right Ear: Tympanic membrane and ear canal normal.      Left Ear: Tympanic membrane and ear canal normal.      Mouth/Throat:      Mouth: Mucous membranes are moist.      Pharynx: Oropharynx is clear.   Eyes:      Conjunctiva/sclera: Conjunctivae normal.      Pupils: Pupils are equal, round, and reactive to light.   Neck:      Vascular: No carotid bruit.      Trachea: No tracheal deviation.   Cardiovascular:      Rate and Rhythm: Normal rate. Rhythm irregular.      Heart sounds: No murmur.   Pulmonary:      Effort: Pulmonary effort is normal.      Breath sounds: Wheezing present.   Abdominal:      General: Bowel sounds are normal. There is no distension.      Palpations: Abdomen is soft. There is no mass.      Tenderness: There is no abdominal tenderness.   Musculoskeletal:      Cervical back: Neck supple.      Right lower leg: No edema.      Left lower leg: No edema.   Lymphadenopathy:      Cervical: No cervical adenopathy.   Skin: General: Skin is warm and dry.   Neurological:      General: No focal deficit present.      Mental Status: He is alert and oriented to person, place, and time.      Cranial Nerves: No cranial nerve deficit.   Psychiatric:         Mood and Affect: Mood normal.         Behavior: Behavior normal.         Thought Content: Thought content normal.         Judgment: Judgment normal.         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

## 2020-01-24 ENCOUNTER — Encounter: Admit: 2020-01-24 | Discharge: 2020-01-24 | Payer: MEDICARE

## 2020-01-24 LAB — COMPREHENSIVE METABOLIC PANEL
Lab: 0.4 mg/dL (ref 0.3–1.2)
Lab: 1.4 mg/dL — ABNORMAL HIGH (ref 0.4–1.24)
Lab: 107 MMOL/L (ref 98–110)
Lab: 12 U/L (ref 7–56)
Lab: 13 U/L (ref 7–40)
Lab: 141 MMOL/L (ref 137–147)
Lab: 21 mg/dL (ref 7–25)
Lab: 29 MMOL/L (ref 21–30)
Lab: 4.1 g/dL (ref 3.5–5.0)
Lab: 4.6 MMOL/L (ref 3.5–5.1)
Lab: 48 mL/min — ABNORMAL LOW (ref >60)
Lab: 59 mL/min — ABNORMAL LOW (ref >60)
Lab: 6.7 g/dL (ref 6.0–8.0)
Lab: 63 U/L (ref 25–110)
Lab: 85 mg/dL (ref 70–100)
Lab: 9.8 mg/dL (ref 8.5–10.6)
Lab: 5 (ref 3–12)

## 2020-01-24 LAB — LIPID PROFILE
Lab: 113 mg/dL (ref <150)
Lab: 154 mg/dL (ref <200)

## 2020-01-24 LAB — CBC
Lab: 4.6 M/UL — ABNORMAL HIGH (ref <100)
Lab: 7.1 K/UL — ABNORMAL LOW (ref >40)

## 2020-01-24 LAB — MAGNESIUM: Lab: 1.9 mg/dL (ref 1.6–2.6)

## 2020-01-24 LAB — TSH WITH FREE T4 REFLEX: Lab: 2.4 uU/mL (ref 0.35–5.00)

## 2020-01-24 NOTE — Progress Notes
BP 139/94 HR 67 at home this morning before medications. Pt took his medications this morning including toprol XL 100 mg, 25 mg losartan and 500 mcg Tikosyn and BP an hour later 135/86 HR 75. Pt is feeling well and has no complaints. BP in clinic was 140/88 HR 60. Pt is concerned about his elevated BP readings and is taking medications as directed. He has been holding spironolactone. Instructed pt we will review with [facility] and call with recommendations. Pt verbalized understanding.       01/12/20 Nurse visit BP check:    Rogelia Boga, RN   RN      Progress Notes ?    Signed   Creation Time:  01/12/20 1420                      Pt in clinic today for BP check and EKG after calling in yesterday with symptoms of dizziness, clamminess, and hypotension.  ?  His EKG showed SR, reviewed by Dr. Sandria Manly in clinic.  BP today in office were: 136/86, 125/81, 133/91, HR 70.  ?  BP readings from home are:   5/27 - 83/51, 90/63, 101/65  5/28- 128/87, 108/77, 103/66  Heart rate in the 60s.  ?  Pt reports that he is feeling okay today.  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  He sometimes develops chest pain that is located mid-sternally, on the left side without radiation.  He denies any worsening shortness of breath or nausea, but does feel clammy/diaphretic.  He also occasionally has left sided neck pain.  He states that these just happen occasionally and do not last very long.    ?  He is taking medications as prescribed.  Pt states that he held his losartan and spironolactone yesterday as directed.    ?  Discussed with Dr. Sandria Manly in clinic. He recommended continuing to hold spironolactone, and restart losartan at 25mg  daily.  Pt is agreeable to plan. Alonso will continue to monitor his BP and call us with his readings next week.  ?

## 2020-02-06 ENCOUNTER — Encounter: Admit: 2020-02-06 | Discharge: 2020-02-06 | Payer: MEDICARE

## 2020-02-06 MED ORDER — LOSARTAN 50 MG PO TAB
50 mg | ORAL_TABLET | Freq: Every day | ORAL | 3 refills | 30.00000 days | Status: DC
Start: 2020-02-06 — End: 2020-03-18

## 2020-02-06 NOTE — Telephone Encounter
Patient calls with concerns of elevated blood pressure. Patient states in the mornings before taking his medications his blood pressures have been 160s/110s and then 140s/90s a couple of hours following medication. Patient is currently taking metoprolol 100 mg daily in the am, losartan 25 mg daily in the am and tikosyn 500 mcg twice daily. About one month ago he was in the office for an EKG and blood pressure check and his bp had been running low with symptoms, at that time Dr. Sandria Manly was consulted in the office and decreased the patient's losartan from 50 mg daily to 25 mg. Will route to Dr. Barry Dienes for his recommendations.

## 2020-02-06 NOTE — Telephone Encounter
Notified patient of increasing losartan to 50 mg daily from 25 mg daily.    Vanice Sarah, MD  Lauralee Evener, RN  Caller: Unspecified (Today, 10:42 AM)  Let's start by just increasing losartan back to 50 mg/day, thanks.      ?

## 2020-02-08 MED ORDER — SPIRONOLACTONE 25 MG PO TAB
12.5 mg | ORAL_TABLET | Freq: Every day | ORAL | 11 refills | 90.00000 days | Status: AC
Start: 2020-02-08 — End: ?

## 2020-02-12 ENCOUNTER — Encounter: Admit: 2020-02-12 | Discharge: 2020-02-12 | Payer: MEDICARE

## 2020-02-13 ENCOUNTER — Encounter: Admit: 2020-02-13 | Discharge: 2020-02-13 | Payer: MEDICARE

## 2020-02-15 ENCOUNTER — Encounter: Admit: 2020-02-15 | Discharge: 2020-02-15 | Payer: MEDICARE

## 2020-02-15 DIAGNOSIS — I10 Essential (primary) hypertension: Secondary | ICD-10-CM

## 2020-02-15 DIAGNOSIS — G4734 Idiopathic sleep related nonobstructive alveolar hypoventilation: Secondary | ICD-10-CM

## 2020-02-15 DIAGNOSIS — I513 Intracardiac thrombosis, not elsewhere classified: Secondary | ICD-10-CM

## 2020-02-15 DIAGNOSIS — E7800 Pure hypercholesterolemia, unspecified: Secondary | ICD-10-CM

## 2020-02-15 DIAGNOSIS — K219 Gastro-esophageal reflux disease without esophagitis: Secondary | ICD-10-CM

## 2020-02-15 DIAGNOSIS — I4819 Other persistent atrial fibrillation: Secondary | ICD-10-CM

## 2020-02-15 DIAGNOSIS — J449 Chronic obstructive pulmonary disease, unspecified: Secondary | ICD-10-CM

## 2020-02-15 DIAGNOSIS — I5022 Chronic systolic (congestive) heart failure: Secondary | ICD-10-CM

## 2020-02-15 MED ORDER — METOPROLOL SUCCINATE 100 MG PO TB24
ORAL_TABLET | Freq: Every day | ORAL | 11 refills | 90.00000 days | Status: AC
Start: 2020-02-15 — End: ?

## 2020-02-23 ENCOUNTER — Encounter: Admit: 2020-02-23 | Discharge: 2020-02-23 | Payer: MEDICARE

## 2020-02-23 DIAGNOSIS — I10 Essential (primary) hypertension: Secondary | ICD-10-CM

## 2020-03-15 ENCOUNTER — Encounter: Admit: 2020-03-15 | Discharge: 2020-03-15 | Payer: MEDICARE

## 2020-03-15 ENCOUNTER — Ambulatory Visit: Admit: 2020-03-15 | Discharge: 2020-03-16 | Payer: MEDICARE

## 2020-03-15 MED ORDER — LOSARTAN 50 MG PO TAB
ORAL_TABLET | Freq: Every day | 0 refills
Start: 2020-03-15 — End: ?

## 2020-03-15 NOTE — Patient Instructions
FOLLOW-UP:  We will have a follow-up visit on December 2nd at 9am via telephone.  Please call [phone] with any questions.      Thank you for visiting with [place] today! We are happy to have taken part in your care!

## 2020-03-16 ENCOUNTER — Encounter: Admit: 2020-03-16 | Discharge: 2020-03-16 | Payer: MEDICARE

## 2020-03-16 DIAGNOSIS — I10 Essential (primary) hypertension: Secondary | ICD-10-CM

## 2020-03-18 ENCOUNTER — Encounter: Admit: 2020-03-18 | Discharge: 2020-03-18 | Payer: MEDICARE

## 2020-03-18 MED ORDER — LOSARTAN 50 MG PO TAB
ORAL_TABLET | Freq: Every day | 0 refills
Start: 2020-03-18 — End: ?

## 2020-03-18 MED ORDER — LOSARTAN 50 MG PO TAB
50 mg | ORAL_TABLET | Freq: Every day | ORAL | 3 refills | 30.00000 days | Status: AC
Start: 2020-03-18 — End: ?

## 2020-04-16 ENCOUNTER — Encounter: Admit: 2020-04-16 | Discharge: 2020-04-16 | Payer: MEDICARE

## 2020-04-16 DIAGNOSIS — I5022 Chronic systolic (congestive) heart failure: Secondary | ICD-10-CM

## 2020-04-16 DIAGNOSIS — F419 Anxiety disorder, unspecified: Secondary | ICD-10-CM

## 2020-04-16 DIAGNOSIS — A0472 Enterocolitis due to Clostridium difficile, not specified as recurrent: Secondary | ICD-10-CM

## 2020-04-16 DIAGNOSIS — R238 Other skin changes: Secondary | ICD-10-CM

## 2020-04-16 DIAGNOSIS — E785 Hyperlipidemia, unspecified: Secondary | ICD-10-CM

## 2020-04-16 DIAGNOSIS — Z7901 Long term (current) use of anticoagulants: Secondary | ICD-10-CM

## 2020-04-16 DIAGNOSIS — I82409 Acute embolism and thrombosis of unspecified deep veins of unspecified lower extremity: Secondary | ICD-10-CM

## 2020-04-16 DIAGNOSIS — R079 Chest pain, unspecified: Secondary | ICD-10-CM

## 2020-04-16 DIAGNOSIS — I10 Essential (primary) hypertension: Secondary | ICD-10-CM

## 2020-04-16 DIAGNOSIS — Z8679 Personal history of other diseases of the circulatory system: Secondary | ICD-10-CM

## 2020-04-16 DIAGNOSIS — I480 Paroxysmal atrial fibrillation: Secondary | ICD-10-CM

## 2020-04-16 DIAGNOSIS — R002 Palpitations: Secondary | ICD-10-CM

## 2020-04-16 DIAGNOSIS — R9439 Abnormal result of other cardiovascular function study: Secondary | ICD-10-CM

## 2020-04-16 DIAGNOSIS — I2510 Atherosclerotic heart disease of native coronary artery without angina pectoris: Secondary | ICD-10-CM

## 2020-04-16 DIAGNOSIS — I4891 Unspecified atrial fibrillation: Secondary | ICD-10-CM

## 2020-04-16 DIAGNOSIS — I426 Alcoholic cardiomyopathy: Secondary | ICD-10-CM

## 2020-04-16 DIAGNOSIS — M549 Dorsalgia, unspecified: Secondary | ICD-10-CM

## 2020-04-16 DIAGNOSIS — Z86718 Personal history of other venous thrombosis and embolism: Secondary | ICD-10-CM

## 2020-04-16 DIAGNOSIS — R198 Other specified symptoms and signs involving the digestive system and abdomen: Secondary | ICD-10-CM

## 2020-04-16 DIAGNOSIS — J449 Chronic obstructive pulmonary disease, unspecified: Secondary | ICD-10-CM

## 2020-04-16 MED ORDER — ALPRAZOLAM 0.5 MG PO TAB
.5 mg | ORAL_TABLET | Freq: Three times a day (TID) | ORAL | 5 refills | Status: AC | PRN
Start: 2020-04-16 — End: ?

## 2020-04-16 MED ORDER — ALPRAZOLAM 0.5 MG PO TAB
.5 mg | ORAL_TABLET | Freq: Every evening | ORAL | 0 refills | Status: DC | PRN
Start: 2020-04-16 — End: 2020-04-16

## 2020-04-16 MED ORDER — ALPRAZOLAM 0.5 MG PO TAB
.5 mg | ORAL_TABLET | Freq: Three times a day (TID) | ORAL | 0 refills | Status: DC | PRN
Start: 2020-04-16 — End: 2020-04-16

## 2020-04-16 NOTE — Assessment & Plan Note
He has variability in his blood pressure such that it is high in the morning to lower the evening.  I instructed him to take his metoprolol in the evening instead of taking all of his medications in the morning.

## 2020-04-16 NOTE — Assessment & Plan Note
Today's EKG shows a normal QT interval and the dofetilide is fairly effective in suppressing recurrent atrial fibrillation.  He is compliant with Xarelto and has had no bleeding or embolic complications.

## 2020-04-16 NOTE — Progress Notes
Date of Service: 04/16/2020    Jonathan Barr is a 64 y.o. male.       HPI     Jonathan Barr was in the [facility] today for follow-up regarding paroxysmal atrial fibrillation.  I was really sorry to hear that his 26 year old son was murdered out in [place] back in February.  It sounds like he was a pretty troubled kid but Jonathan Barr really raised him himself until the boy was 5 or 64 years old and it is really been very upsetting.    From a cardiovascular standpoint I think Jonathan Barr is doing okay.  He has occasional episodes of atrial fibrillation but nothing that lasts very long.    He is not really having trouble with chest discomfort or breathlessness.  He has had no bleeding complications related to oral anticoagulation has had no TIA or stroke symptoms.         Vitals:    04/16/20 1252 04/16/20 1302   BP: (!) 142/84 (!) 136/90   BP Source: Arm, Left Upper Arm, Right Upper   Patient Position: Sitting Sitting   Pulse: 70    SpO2: 98%    Weight: 95.1 kg (209 lb 9.6 oz)    Height: 1.854 m (6' 1)    PainSc: Zero      Body mass index is 27.65 kg/m?Marland Kitchen     Past Medical History  Patient Active Problem List    Diagnosis Date Noted   ? Cellulitis of left leg 07/12/2018   ? AKI (acute kidney injury) (HCC) 07/12/2018   ? History of DVT (deep vein thrombosis) 07/12/2018   ? Chronic anticoagulation, on rivaroxiban 07/12/2018   ? Atrial fibrillation with rapid ventricular response (HCC) 07/11/2018   ? Sepsis (HCC) 07/11/2018   ? Traumatic hematoma of knee 05/31/2018   ? 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 Umbilical hernia 03/30/2016     Added automatically from request for surgery [phone]     ? Paroxysmal atrial fibrillation (HCC) 12/20/2015     02/22/01 Holter:  Predominant SR, Rare VPD's with bigeminy.  Rare non-sustained SVT.  Rare APD's    12/20/15 TEE with evidence of LA thrombus. Canceled DCCV. Digoxin loaded.  12/2015 - Successful TEE-cardioversion.  Started on Tikosyn.     ? COPD (chronic obstructive pulmonary disease) (HCC) 12/18/2015   ? Nocturnal oxygen desaturation - O2 use 12/18/2015   ? Colon polyp 02/07/2013     12/2012 - Colonoscopy     ? GERD (gastroesophageal reflux disease) 02/07/2013     12/2012- EGD negative for Barrett's     ? Pulmonary nodule 02/07/2013     12/2012 - CT-chest 4.7 mm nodular opacity of peripheral left lower lobe.  F/U CT recommended in 6 months.     ? Essential hypertension 04/24/2011   ? History of tobacco use, quit in 2012 04/24/2011   ? History of alcoholic cardiomyopathy      EF  48%  per stress echo 02/29/00.    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  Normal study.     ? Anxiety    ? Chest pain 09/24/2006        a. Stress Echo 02/29/00 with mild resting global LV asyneresis. Induced inf and post wall myocardial ischemia      b. 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  c.  Cardiac cath 2/08 - Normal coronaries, normal LV function       ? Hyperlipidemia          Review of Systems   Constitutional: Positive for malaise/fatigue.   HENT: Positive for nosebleeds, stridor and tinnitus.    Eyes: Negative.    Cardiovascular: Positive for dyspnea on exertion, irregular heartbeat, leg swelling and palpitations.        Chest tightness   Respiratory: Positive for cough, shortness of breath, sputum production and wheezing.    Endocrine: Negative.    Hematologic/Lymphatic: Bruises/bleeds easily.   Skin: Negative.    Musculoskeletal: Positive for muscle weakness and neck pain.   Gastrointestinal: Positive for diarrhea.   Genitourinary: Positive for flank pain.   Neurological: Positive for headaches, light-headedness and weakness.   Psychiatric/Behavioral: Negative.    Allergic/Immunologic: Negative.        Physical Exam    Physical Exam   General Appearance: no distress   Skin: warm, no ulcers or xanthomas   Digits and Nails: no cyanosis or clubbing   Eyes: conjunctivae and lids normal, pupils are equal and round   Teeth/Gums/Palate: dentition unremarkable, no lesions   Lips & Oral Mucosa: no pallor or cyanosis   Neck Veins: normal JVP , neck veins are not distended   Thyroid: no nodules, masses, tenderness or enlargement   Chest Inspection: chest is normal in appearance   Respiratory Effort: breathing comfortably, no respiratory distress   Auscultation/Percussion: lungs clear to auscultation, no rales or rhonchi, no wheezing   PMI: PMI not enlarged or displaced   Cardiac Rhythm: regular rhythm and normal rate   Cardiac Auscultation: S1, S2 normal, no rub, no gallop   Murmurs: no murmur   Peripheral Circulation: normal peripheral circulation   Carotid Arteries: normal carotid upstroke bilaterally, no bruits   Radial Arteries: normal symmetric radial pulses   Abdominal Aorta: no abdominal aortic bruit   Pedal Pulses: normal symmetric pedal pulses   Lower Extremity Edema: no lower extremity edema   Abdominal Exam: soft, non-tender, no masses, bowel sounds normal   Liver & Spleen: no organomegaly   Gait & Station: walks without assistance   Muscle Strength: normal muscle tone   Orientation: oriented to time, place and person   Affect & Mood: appropriate and sustained affect   Language and Memory: patient responsive and seems to comprehend information   Neurologic Exam: neurological assessment grossly intact   Other: moves all extremities      Cardiovascular Studies    EKG:  SR, rate 57    Problems Addressed Today  Encounter Diagnoses   Name Primary?   ? Chest pain, unspecified type    ? Anxiety    ? Paroxysmal atrial fibrillation (HCC)    ? Essential hypertension        Assessment and Plan       Paroxysmal atrial fibrillation (HCC)  Today's EKG shows a normal QT interval and the dofetilide is fairly effective in suppressing recurrent atrial fibrillation.  He is compliant with Xarelto and has had no bleeding or embolic complications.    Essential hypertension  He has variability in his blood pressure such that it is high in the morning to lower the evening.  I instructed him to take his metoprolol in the evening instead of taking all of his medications in the morning.      Current Medications (including today's revisions)  ? acetaminophen SR(+) (TYLENOL) 650 mg tablet Take 650 mg by mouth every  6 hours as needed for Pain.   ? albuterol (VENTOLIN HFA, PROAIR HFA, PROVENTIL HFA) 90 mcg/actuation inhaler Inhale 2 Puffs by mouth into the lungs every 6 hours as needed for Wheezing or Shortness of Breath. Shake well before use.   ? ALPRAZolam (XANAX) 0.5 mg tablet Take one tablet by mouth three times daily as needed for Sleep or Anxiety.   ? budesonide-formoterol (SYMBICORT HFA) 160-4.5 mcg/actuation inhalation Inhale two puffs by mouth into the lungs twice daily.   ? COMBIVENT RESPIMAT 20-100 mcg/actuation inhaler INHALE 1 PUFF BY MOUTH 4 TIMES DAILY   ? dofetilide (TIKOSYN) 500 mcg capsule Take 1 capsule by mouth twice daily   ? L.ACID/L.CASEI/B.BIF/B.LON/FOS (PROBIOTIC BLEND PO) Take 1 tablet by mouth daily.   ? losartan (COZAAR) 50 mg tablet Take one tablet by mouth daily.   ? Magnesium Oxide 500 mg tab Take 500 mg by mouth twice daily.   ? metoprolol XL (TOPROL XL) 100 mg extended release tablet Take 1 tablet by mouth once daily   ? omeprazole DR(+) (PRILOSEC) 20 mg capsule Take 20 mg by mouth daily.   ? pravastatin (PRAVACHOL) 80 mg tablet Take one tablet by mouth at bedtime daily.   ? spironolactone (ALDACTONE) 25 mg tablet Take one-half tablet by mouth daily. Take with food.   ? XARELTO 20 mg tablet TAKE 1 TABLET BY MOUTH ONCE DAILY WITH DINNER     Total time spent on today's office visit was 30 minutes.  This includes face-to-face in person visit with patient as well as nonface-to-face time including review of the EMR, outside records, labs, radiologic studies, echocardiogram & other cardiovascular studies, formation of treatment plan, after visit summary, future disposition, and lastly on documentation.

## 2020-05-20 ENCOUNTER — Encounter: Admit: 2020-05-20 | Discharge: 2020-05-20 | Payer: MEDICARE

## 2020-06-10 ENCOUNTER — Ambulatory Visit: Admit: 2020-06-10 | Discharge: 2020-06-10 | Payer: MEDICARE

## 2020-06-10 ENCOUNTER — Encounter: Admit: 2020-06-10 | Discharge: 2020-06-10 | Payer: MEDICARE

## 2020-06-10 DIAGNOSIS — E785 Hyperlipidemia, unspecified: Secondary | ICD-10-CM

## 2020-06-10 DIAGNOSIS — I5022 Chronic systolic (congestive) heart failure: Secondary | ICD-10-CM

## 2020-06-10 DIAGNOSIS — Z23 Encounter for immunization: Secondary | ICD-10-CM

## 2020-06-10 DIAGNOSIS — I2510 Atherosclerotic heart disease of native coronary artery without angina pectoris: Secondary | ICD-10-CM

## 2020-06-10 DIAGNOSIS — R238 Other skin changes: Secondary | ICD-10-CM

## 2020-06-10 DIAGNOSIS — Z125 Encounter for screening for malignant neoplasm of prostate: Secondary | ICD-10-CM

## 2020-06-10 DIAGNOSIS — M549 Dorsalgia, unspecified: Secondary | ICD-10-CM

## 2020-06-10 DIAGNOSIS — Z86718 Personal history of other venous thrombosis and embolism: Secondary | ICD-10-CM

## 2020-06-10 DIAGNOSIS — A0472 Enterocolitis due to Clostridium difficile, not specified as recurrent: Secondary | ICD-10-CM

## 2020-06-10 DIAGNOSIS — R198 Other specified symptoms and signs involving the digestive system and abdomen: Secondary | ICD-10-CM

## 2020-06-10 DIAGNOSIS — J449 Chronic obstructive pulmonary disease, unspecified: Secondary | ICD-10-CM

## 2020-06-10 DIAGNOSIS — R197 Diarrhea, unspecified: Secondary | ICD-10-CM

## 2020-06-10 DIAGNOSIS — Z5181 Encounter for therapeutic drug level monitoring: Secondary | ICD-10-CM

## 2020-06-10 DIAGNOSIS — I4891 Unspecified atrial fibrillation: Secondary | ICD-10-CM

## 2020-06-10 DIAGNOSIS — Z8679 Personal history of other diseases of the circulatory system: Secondary | ICD-10-CM

## 2020-06-10 DIAGNOSIS — N433 Hydrocele, unspecified: Secondary | ICD-10-CM

## 2020-06-10 DIAGNOSIS — I426 Alcoholic cardiomyopathy: Secondary | ICD-10-CM

## 2020-06-10 DIAGNOSIS — I10 Essential (primary) hypertension: Secondary | ICD-10-CM

## 2020-06-10 DIAGNOSIS — R002 Palpitations: Secondary | ICD-10-CM

## 2020-06-10 DIAGNOSIS — R9439 Abnormal result of other cardiovascular function study: Secondary | ICD-10-CM

## 2020-06-10 DIAGNOSIS — Z7901 Long term (current) use of anticoagulants: Secondary | ICD-10-CM

## 2020-06-10 DIAGNOSIS — I82409 Acute embolism and thrombosis of unspecified deep veins of unspecified lower extremity: Secondary | ICD-10-CM

## 2020-06-10 DIAGNOSIS — N5089 Other specified disorders of the male genital organs: Secondary | ICD-10-CM

## 2020-06-10 DIAGNOSIS — F419 Anxiety disorder, unspecified: Secondary | ICD-10-CM

## 2020-06-10 LAB — COMPREHENSIVE METABOLIC PANEL
Lab: 10 U/L (ref 7–56)
Lab: 140 MMOL/L — ABNORMAL LOW (ref >40)
Lab: 28 MMOL/L (ref 21–30)
Lab: 5.4 MMOL/L — ABNORMAL HIGH (ref <100)
Lab: 53 mL/min — ABNORMAL LOW (ref >60)
Lab: >60 mL/min (ref >60)
Lab: 7 (ref 3–12)

## 2020-06-10 LAB — CBC
Lab: 10 FL (ref 7–11)
Lab: 14 % (ref 11–15)
Lab: 14 g/dL (ref 13.5–16.5)
Lab: 198 10*3/uL (ref 150–400)
Lab: 29 pg (ref 26–34)
Lab: 33 g/dL (ref 32.0–36.0)
Lab: 4.9 M/UL (ref 4.4–5.5)
Lab: 44 % — ABNORMAL HIGH (ref 40–50)
Lab: 7.8 K/UL (ref >60)
Lab: 89 FL (ref 80–100)

## 2020-06-10 LAB — MAGNESIUM: Lab: 1.9 mg/dL (ref 1.6–2.6)

## 2020-06-10 LAB — PSA SCREEN: Lab: 0.2 ng/mL (ref <4.01)

## 2020-06-10 LAB — LIPID PROFILE
Lab: 139 mg/dL (ref <150)
Lab: 166 mg/dL (ref <200)

## 2020-06-10 NOTE — Progress Notes
Date of Service: 06/10/2020    Jonathan Barr is a 64 y.o. male. DOB: 22-Oct-1955   MRN#: [id number]    Subjective:       64 yo male here for f/u of medical issues  Had diarrhea for about 2 months- stopped 2 weeks ago  Is taking a probiotic  Now they are soft- they were watery  Had a [place] of his scrotum for scrotal enlargement - has bilateral hydroceles  Needs a flu shot  Declined a covid shot- trying to be careful- not going out much  Fasting for lab work  Reviewed hx in epic briefly  Hx of afib-COPD-htn-GERD- hyperlipidemia-cardiomyopathy           Review of Systems   Constitutional: Negative for chills, diaphoresis and fever.        Lost 8 lbs since last June  occ sweats   HENT:        No recent URI  sxs   Eyes:        Has glasses   Cardiovascular: Positive for palpitations. Negative for leg swelling.        Had some chest tightness- not pain   Respiratory: Negative for cough and shortness of breath.    Hematologic/Lymphatic:        On xarelto   Skin: Negative for rash.        Has a rash in his groin -using athlete's foot powder on it   Musculoskeletal:        Has left sided neck pain   Gastrointestinal:        His diarrhea has stopped  Had n/v a few weeks ago- has resolved   Genitourinary: Negative for dysuria and hematuria.        Has a slow urinary stream   Neurological: Positive for headaches.        Having some tension headaches   Psychiatric/Behavioral:        Some financial stress         Objective:     ? acetaminophen SR(+) (TYLENOL) 650 mg tablet Take 650 mg by mouth every 6 hours as needed for Pain.   ? albuterol (VENTOLIN HFA, PROAIR HFA, PROVENTIL HFA) 90 mcg/actuation inhaler Inhale 2 Puffs by mouth into the lungs every 6 hours as needed for Wheezing or Shortness of Breath. Shake well before use.   ? ALPRAZolam (XANAX) 0.5 mg tablet Take one tablet by mouth three times daily as needed for Sleep or Anxiety.   ? budesonide-formoterol (SYMBICORT HFA) 160-4.5 mcg/actuation inhalation Inhale two puffs by mouth into the lungs twice daily.   ? COMBIVENT RESPIMAT 20-100 mcg/actuation inhaler INHALE 1 PUFF BY MOUTH 4 TIMES DAILY   ? dofetilide (TIKOSYN) 500 mcg capsule Take 1 capsule by mouth twice daily   ? L.ACID/L.CASEI/B.BIF/B.LON/FOS (PROBIOTIC BLEND PO) Take 1 tablet by mouth daily.   ? losartan (COZAAR) 50 mg tablet Take one tablet by mouth daily.   ? Magnesium Oxide 500 mg tab Take 500 mg by mouth twice daily.   ? metoprolol XL (TOPROL XL) 100 mg extended release tablet Take 1 tablet by mouth once daily   ? omeprazole DR(+) (PRILOSEC) 20 mg capsule Take 20 mg by mouth daily.   ? pravastatin (PRAVACHOL) 80 mg tablet Take one tablet by mouth at bedtime daily.   ? spironolactone (ALDACTONE) 25 mg tablet Take one-half tablet by mouth daily. Take with food.   ? XARELTO 20 mg tablet TAKE 1 TABLET BY MOUTH ONCE DAILY WITH  DINNER     Vitals:    06/10/20 1045   BP: 112/78   BP Source: Arm, Left Upper   Patient Position: Sitting   Pulse: 66   Temp: 36.7 ?C (98.1 ?F)   TempSrc: Temporal   SpO2: 97%   Weight: 91.6 kg (202 lb)   Height: 185.4 cm (73)   PainSc: Zero     Body mass index is 26.65 kg/m?Marland Kitchen     Physical Exam  Vitals and nursing note reviewed.   Constitutional:       General: He is not in acute distress.     Appearance: Normal appearance. He is well-developed. He is not ill-appearing.   HENT:      Head: Normocephalic and atraumatic.      Right Ear: Tympanic membrane and ear canal normal.      Left Ear: Tympanic membrane and ear canal normal.      Mouth/Throat:      Mouth: Mucous membranes are moist.      Pharynx: Oropharynx is clear.   Eyes:      Conjunctiva/sclera: Conjunctivae normal.      Pupils: Pupils are equal, round, and reactive to light.   Neck:      Trachea: No tracheal deviation.   Cardiovascular:      Rate and Rhythm: Normal rate and regular rhythm.      Heart sounds: No murmur heard.     Pulmonary:      Effort: Pulmonary effort is normal.      Comments: Has upper airway wheezing with forced expiration  Abdominal:      General: Bowel sounds are normal. There is no distension.      Palpations: Abdomen is soft. There is no mass.      Tenderness: There is no abdominal tenderness.   Musculoskeletal:      Cervical back: Neck supple.      Right lower leg: No edema.      Left lower leg: No edema.   Lymphadenopathy:      Cervical: No cervical adenopathy.   Skin:     General: Skin is warm and dry.      Comments: Pink rash left groin   Neurological:      General: No focal deficit present.      Mental Status: He is alert and oriented to person, place, and time.      Cranial Nerves: No cranial nerve deficit.   Psychiatric:         Mood and Affect: Mood normal.         Behavior: Behavior normal.         Thought Content: Thought content normal.         Judgment: Judgment normal.     a/p  Diarrhea- resolved for now- had a colonoscopy in 2019- will observe  Screening psa  Med monitoring- on tikosyn- check magnesium level  Hyperlipidemia- check lab- on pravachol  Bilateral hydroceles- ok to observe  COPD- cont symbicort- out of the donut hole so may try to fill his combivent rx  afib on xarelto-toprol xk and tikosyn  Flu shot today- pt not ready to get his covid shots yet  rtc in June for his physical

## 2020-06-11 ENCOUNTER — Encounter: Admit: 2020-06-11 | Discharge: 2020-06-11 | Payer: MEDICARE

## 2020-06-13 ENCOUNTER — Encounter: Admit: 2020-06-13 | Discharge: 2020-06-13 | Payer: MEDICARE

## 2020-06-13 MED ORDER — KETOCONAZOLE 2 % TP CREA
Freq: Two times a day (BID) | TOPICAL | 1 refills | 30.00000 days | Status: AC | PRN
Start: 2020-06-13 — End: ?

## 2020-07-17 ENCOUNTER — Encounter: Admit: 2020-07-17 | Discharge: 2020-07-17 | Payer: MEDICARE

## 2020-07-17 MED ORDER — SPIRONOLACTONE 25 MG PO TAB
12.5 mg | ORAL_TABLET | Freq: Every day | ORAL | 11 refills | 46.00000 days | Status: AC
Start: 2020-07-17 — End: ?

## 2020-07-18 ENCOUNTER — Encounter: Admit: 2020-07-18 | Discharge: 2020-07-18 | Payer: MEDICARE

## 2020-07-18 NOTE — Progress Notes
Medication Assistance Program packet has been sent to patient for signatures and other required documents (if applicable) . Once received back, medication assistance coordinator will begin processing         Nada Godley  Medication Assitance Coordinator  [phone]

## 2020-08-06 ENCOUNTER — Encounter: Admit: 2020-08-06 | Discharge: 2020-08-06 | Payer: MEDICARE

## 2020-08-06 MED ORDER — COMBIVENT RESPIMAT 20-100 MCG/ACTUATION IN MIST
Freq: Four times a day (QID) | RESPIRATORY_TRACT | 3 refills | 30.00000 days | Status: AC
Start: 2020-08-06 — End: ?

## 2020-08-12 ENCOUNTER — Encounter: Admit: 2020-08-12 | Discharge: 2020-08-12 | Payer: MEDICARE

## 2020-09-04 ENCOUNTER — Encounter: Admit: 2020-09-04 | Discharge: 2020-09-04 | Payer: MEDICARE

## 2020-09-04 MED ORDER — XARELTO 20 MG PO TAB
ORAL_TABLET | Freq: Every day | ORAL | 3 refills | 30.00000 days | Status: AC
Start: 2020-09-04 — End: ?

## 2020-09-11 ENCOUNTER — Encounter: Admit: 2020-09-11 | Discharge: 2020-09-11 | Payer: MEDICARE

## 2020-09-11 DIAGNOSIS — I4819 Other persistent atrial fibrillation: Secondary | ICD-10-CM

## 2020-09-11 DIAGNOSIS — G4734 Idiopathic sleep related nonobstructive alveolar hypoventilation: Secondary | ICD-10-CM

## 2020-09-11 DIAGNOSIS — I513 Intracardiac thrombosis, not elsewhere classified: Secondary | ICD-10-CM

## 2020-09-11 DIAGNOSIS — J449 Chronic obstructive pulmonary disease, unspecified: Secondary | ICD-10-CM

## 2020-09-11 DIAGNOSIS — K219 Gastro-esophageal reflux disease without esophagitis: Secondary | ICD-10-CM

## 2020-09-11 DIAGNOSIS — I5022 Chronic systolic (congestive) heart failure: Secondary | ICD-10-CM

## 2020-09-11 DIAGNOSIS — E7800 Pure hypercholesterolemia, unspecified: Secondary | ICD-10-CM

## 2020-09-11 DIAGNOSIS — I10 Essential (primary) hypertension: Secondary | ICD-10-CM

## 2020-09-11 MED ORDER — DOFETILIDE 500 MCG PO CAP
ORAL_CAPSULE | Freq: Two times a day (BID) | 3 refills | Status: AC
Start: 2020-09-11 — End: ?

## 2020-10-15 ENCOUNTER — Encounter: Admit: 2020-10-15 | Discharge: 2020-10-15 | Payer: MEDICARE

## 2020-10-15 DIAGNOSIS — F419 Anxiety disorder, unspecified: Secondary | ICD-10-CM

## 2020-10-15 DIAGNOSIS — R079 Chest pain, unspecified: Secondary | ICD-10-CM

## 2020-10-15 MED ORDER — ALPRAZOLAM 0.5 MG PO TAB
ORAL_TABLET | Freq: Three times a day (TID) | 3 refills | Status: AC | PRN
Start: 2020-10-15 — End: ?

## 2020-10-16 ENCOUNTER — Encounter: Admit: 2020-10-16 | Discharge: 2020-10-16 | Payer: MEDICARE

## 2020-10-16 MED ORDER — SYMBICORT 160-4.5 MCG/ACTUATION IN HFAA
Freq: Two times a day (BID) | RESPIRATORY_TRACT | 0 refills | 30.00000 days | Status: AC
Start: 2020-10-16 — End: ?

## 2020-10-16 NOTE — Telephone Encounter
LOV 06/10/20 diarrhea

## 2020-11-28 ENCOUNTER — Encounter: Admit: 2020-11-28 | Discharge: 2020-11-28 | Payer: MEDICARE

## 2020-11-28 DIAGNOSIS — A0472 Enterocolitis due to Clostridium difficile, not specified as recurrent: Secondary | ICD-10-CM

## 2020-11-28 DIAGNOSIS — I480 Paroxysmal atrial fibrillation: Secondary | ICD-10-CM

## 2020-11-28 DIAGNOSIS — I82409 Acute embolism and thrombosis of unspecified deep veins of unspecified lower extremity: Secondary | ICD-10-CM

## 2020-11-28 DIAGNOSIS — I10 Essential (primary) hypertension: Secondary | ICD-10-CM

## 2020-11-28 DIAGNOSIS — E782 Mixed hyperlipidemia: Secondary | ICD-10-CM

## 2020-11-28 DIAGNOSIS — R002 Palpitations: Secondary | ICD-10-CM

## 2020-11-28 DIAGNOSIS — I5022 Chronic systolic (congestive) heart failure: Secondary | ICD-10-CM

## 2020-11-28 DIAGNOSIS — F419 Anxiety disorder, unspecified: Secondary | ICD-10-CM

## 2020-11-28 DIAGNOSIS — R9439 Abnormal result of other cardiovascular function study: Secondary | ICD-10-CM

## 2020-11-28 DIAGNOSIS — R198 Other specified symptoms and signs involving the digestive system and abdomen: Secondary | ICD-10-CM

## 2020-11-28 DIAGNOSIS — R238 Other skin changes: Secondary | ICD-10-CM

## 2020-11-28 DIAGNOSIS — I426 Alcoholic cardiomyopathy: Secondary | ICD-10-CM

## 2020-11-28 DIAGNOSIS — Z7901 Long term (current) use of anticoagulants: Secondary | ICD-10-CM

## 2020-11-28 DIAGNOSIS — Z8679 Personal history of other diseases of the circulatory system: Secondary | ICD-10-CM

## 2020-11-28 DIAGNOSIS — R079 Chest pain, unspecified: Secondary | ICD-10-CM

## 2020-11-28 DIAGNOSIS — I2510 Atherosclerotic heart disease of native coronary artery without angina pectoris: Secondary | ICD-10-CM

## 2020-11-28 DIAGNOSIS — Z86718 Personal history of other venous thrombosis and embolism: Secondary | ICD-10-CM

## 2020-11-28 DIAGNOSIS — M549 Dorsalgia, unspecified: Secondary | ICD-10-CM

## 2020-11-28 DIAGNOSIS — J449 Chronic obstructive pulmonary disease, unspecified: Secondary | ICD-10-CM

## 2020-11-28 DIAGNOSIS — I4891 Unspecified atrial fibrillation: Secondary | ICD-10-CM

## 2020-11-28 DIAGNOSIS — E785 Hyperlipidemia, unspecified: Secondary | ICD-10-CM

## 2020-11-28 MED ORDER — ALPRAZOLAM 0.5 MG PO TAB
.5 mg | ORAL_TABLET | Freq: Three times a day (TID) | ORAL | 5 refills | Status: AC | PRN
Start: 2020-11-28 — End: ?

## 2020-11-28 MED ORDER — LOSARTAN 50 MG PO TAB
50 mg | ORAL_TABLET | Freq: Every day | ORAL | 3 refills | 30.00000 days | Status: AC
Start: 2020-11-28 — End: ?

## 2020-11-28 MED ORDER — ALPRAZOLAM 0.5 MG PO TAB
.5 mg | ORAL_TABLET | Freq: Three times a day (TID) | ORAL | 0 refills | Status: DC | PRN
Start: 2020-11-28 — End: 2020-11-28

## 2020-11-28 NOTE — Assessment & Plan Note
Lab Results   Component Value Date    CHOL 166 06/10/2020    TRIG 139 06/10/2020    HDL 37 (L) 06/10/2020    LDL 117 (H) 06/10/2020    VLDL 28 06/10/2020    NONHDLCHOL 129 06/10/2020    CHOLHDLC 4.0 11/19/2017

## 2020-11-28 NOTE — Assessment & Plan Note
I explained to Jonathan Barr that in the management of paroxysmal atrial fibrillation realistic goal is less frequent and less duration.  Complete eradication of the arrhythmia is usually not a very realistic goal.  I think he is doing pretty well on the current dose of Tikosyn suggested that we continue the current strategy.

## 2020-11-28 NOTE — Assessment & Plan Note
His blood pressure looks fine on the current medical program.

## 2020-11-28 NOTE — Progress Notes
Date of Service: 11/28/2020    Jonathan Barr is a 65 y.o. male.       HPI     Jonathan Barr was in the [place] clinic today for follow-up regarding paroxysmal atrial fibrillation.  He has done pretty well on Tikosyn but he occasionally has some breakthrough arrhythmia.  It frightens him a little when he has the atrial fib but it does not seem to be happening very frequently.    Other than the occasional bursts of palpitations I think he is done pretty well.  He is not having chest pain or breathlessness.  Is kind of chronically mildly depressed and he spends a lot of time watching the news and that has not helped him much over the past few months.    He has not had any complications related to oral anticoagulation.  He has not had any TIA or stroke symptoms.         Vitals:    11/28/20 1552   BP: 120/78   BP Source: Arm, Left Upper   Pulse: 61   SpO2: 96%   O2 Device: None (Room air)   PainSc: Zero   Weight: 94.8 kg (209 lb)   Height: 185.4 cm (6' 1)     Body mass index is 27.57 kg/m?Marland Kitchen     Past Medical History  Patient Active Problem List    Diagnosis Date Noted   ? Cellulitis of left leg 07/12/2018   ? AKI (acute kidney injury) (HCC) 07/12/2018   ? History of DVT (deep vein thrombosis) 07/12/2018   ? Chronic anticoagulation, on rivaroxiban 07/12/2018   ? Atrial fibrillation with rapid ventricular response (HCC) 07/11/2018   ? Sepsis (HCC) 07/11/2018   ? Traumatic hematoma of knee 05/31/2018   ? 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 Umbilical hernia 03/30/2016     Added automatically from request for surgery [id number]     ? Paroxysmal atrial fibrillation (HCC) 12/20/2015     02/22/01 Holter:  Predominant SR, Rare VPD's with bigeminy.  Rare non-sustained SVT.  Rare APD's    12/20/15 TEE with evidence of LA thrombus. Canceled DCCV. Digoxin loaded.  12/2015 - Successful TEE-cardioversion.  Started on Tikosyn.     ? COPD (chronic obstructive pulmonary disease) (HCC) 12/18/2015   ? Nocturnal oxygen desaturation - O2 use 12/18/2015   ? Colon polyp 02/07/2013     12/2012 - Colonoscopy     ? GERD (gastroesophageal reflux disease) 02/07/2013     12/2012- EGD negative for Barrett's     ? Pulmonary nodule 02/07/2013     12/2012 - CT-chest 4.7 mm nodular opacity of peripheral left lower lobe.  F/U CT recommended in 6 months.     ? Essential hypertension 04/24/2011   ? History of tobacco use, quit in 2012 04/24/2011   ? History of alcoholic cardiomyopathy      EF  48%  per stress echo 02/29/00.    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  Normal study.     ? Anxiety    ? Chest pain 09/24/2006        a. Stress Echo 02/29/00 with mild resting global LV asyneresis. Induced inf and post wall myocardial ischemia      b. 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      c.  Cardiac cath 2/08 -  Normal coronaries, normal LV function       ? Hyperlipidemia          Review of Systems   Constitutional: Negative.   HENT: Negative.    Eyes: Negative.    Cardiovascular: Negative.    Respiratory: Negative.    Endocrine: Negative.    Hematologic/Lymphatic: Negative.    Skin: Negative.    Musculoskeletal: Negative.    Gastrointestinal: Negative.    Genitourinary: Negative.    Neurological: Negative.    Psychiatric/Behavioral: Negative.    Allergic/Immunologic: Negative.        Physical Exam    Physical Exam   General Appearance: no distress   Skin: warm, no ulcers or xanthomas   Digits and Nails: no cyanosis or clubbing   Eyes: conjunctivae and lids normal, pupils are equal and round   Teeth/Gums/Palate: dentition unremarkable, no lesions   Lips & Oral Mucosa: no pallor or cyanosis   Neck Veins: normal JVP , neck veins are not distended   Thyroid: no nodules, masses, tenderness or enlargement   Chest Inspection: chest is normal in appearance   Respiratory Effort: breathing comfortably, no respiratory distress Auscultation/Percussion: lungs clear to auscultation, no rales or rhonchi, no wheezing   PMI: PMI not enlarged or displaced   Cardiac Rhythm: regular rhythm and normal rate   Cardiac Auscultation: S1, S2 normal, no rub, no gallop   Murmurs: no murmur   Peripheral Circulation: normal peripheral circulation   Carotid Arteries: normal carotid upstroke bilaterally, no bruits   Radial Arteries: normal symmetric radial pulses   Abdominal Aorta: no abdominal aortic bruit   Pedal Pulses: normal symmetric pedal pulses   Lower Extremity Edema: no lower extremity edema   Abdominal Exam: soft, non-tender, no masses, bowel sounds normal   Liver & Spleen: no organomegaly   Gait & Station: walks without assistance   Muscle Strength: normal muscle tone   Orientation: oriented to time, place and person   Affect & Mood: appropriate and sustained affect   Language and Memory: patient responsive and seems to comprehend information   Neurologic Exam: neurological assessment grossly intact   Other: moves all extremities      Cardiovascular Studies    EKG:  SR, rate 56.  QTc 429 msec.    Cardiovascular Health Factors  Vitals BP Readings from Last 3 Encounters:   11/28/20 120/78   06/10/20 112/78   04/16/20 (!) 136/90     Wt Readings from Last 3 Encounters:   11/28/20 94.8 kg (209 lb)   06/10/20 91.6 kg (202 lb)   04/16/20 95.1 kg (209 lb 9.6 oz)     BMI Readings from Last 3 Encounters:   11/28/20 27.57 kg/m?   06/10/20 26.65 kg/m?   04/16/20 27.65 kg/m?      Smoking Social History     Tobacco Use   Smoking Status Former Smoker   ? Packs/day: 0.50   ? Years: 30.00   ? Pack years: 15.00   ? Types: Cigarettes   ? Quit date: 12/06/2010   ? Years since quitting: 9.9   Smokeless Tobacco Never Used   Tobacco Comment    cessation counseling given 07/15/10      Lipid Profile Cholesterol   Date Value Ref Range Status   06/10/2020 166 <200 MG/DL Final     HDL   Date Value Ref Range Status   06/10/2020 37 (L) >40 MG/DL Final     LDL   Date Value Ref Range Status  06/10/2020 117 (H) <100 mg/dL Final     Triglycerides   Date Value Ref Range Status   06/10/2020 139 <150 MG/DL Final      Blood Sugar Hemoglobin A1C   Date Value Ref Range Status   07/24/2016 6.0 4.0 - 6.0 % Final     Comment:     The ADA recommends that most patients with type 1 and type 2 diabetes maintain   an A1c level <7%.       Glucose   Date Value Ref Range Status   06/10/2020 94 70 - 100 MG/DL Final   16/05/9603 85 70 - 100 MG/DL Final   54/04/8118 92 70 - 100 MG/DL Final     Glucose, POC   Date Value Ref Range Status   07/11/2018 77 70 - 100 MG/DL Final   14/78/2956 65 (L) 70 - 100 MG/DL Final          Problems Addressed Today  Encounter Diagnoses   Name Primary?   ? Paroxysmal atrial fibrillation (HCC) Yes   ? Chest pain, unspecified type    ? Anxiety    ? Mixed hyperlipidemia    ? Essential hypertension        Assessment and Plan       Paroxysmal atrial fibrillation (HCC)  I explained to Dedric that in the management of paroxysmal atrial fibrillation realistic goal is less frequent and less duration.  Complete eradication of the arrhythmia is usually not a very realistic goal.  I think he is doing pretty well on the current dose of Tikosyn suggested that we continue the current strategy.    Hyperlipidemia  Lab Results   Component Value Date    CHOL 166 06/10/2020    TRIG 139 06/10/2020    HDL 37 (L) 06/10/2020    LDL 117 (H) 06/10/2020    VLDL 28 06/10/2020    NONHDLCHOL 129 06/10/2020    CHOLHDLC 4.0 11/19/2017          Essential hypertension  His blood pressure looks fine on the current medical program.      Current Medications (including today's revisions)  ? acetaminophen SR(+) (TYLENOL) 650 mg tablet Take 650 mg by mouth every 6 hours as needed for Pain.   ? albuterol (VENTOLIN HFA, PROAIR HFA, PROVENTIL HFA) 90 mcg/actuation inhaler Inhale 2 Puffs by mouth into the lungs every 6 hours as needed for Wheezing or Shortness of Breath. Shake well before use.   ? ALPRAZolam (XANAX) 0.5 mg tablet Take one tablet by mouth three times daily as needed for Anxiety.   ? atorvastatin (LIPITOR) 40 mg tablet Take one tablet by mouth daily.   ? COMBIVENT RESPIMAT 20-100 mcg/actuation inhaler INHALE 1 PUFF BY MOUTH 4 TIMES DAILY   ? dofetilide (TIKOSYN) 500 mcg capsule Take 1 capsule by mouth twice daily   ? ketoconazole (NIZORAL) 2 % topical cream Apply to affected area bid until clear then prn   ? L.ACID/L.CASEI/B.BIF/B.LON/FOS (PROBIOTIC BLEND PO) Take 1 tablet by mouth daily.   ? losartan (COZAAR) 50 mg tablet Take one tablet by mouth daily.   ? Magnesium Oxide 500 mg tab Take 500 mg by mouth twice daily.   ? metoprolol XL (TOPROL XL) 100 mg extended release tablet Take 1 tablet by mouth once daily   ? omeprazole DR(+) (PRILOSEC) 20 mg capsule Take 20 mg by mouth daily.   ? spironolactone (ALDACTONE) 25 mg tablet Take one-half tablet by mouth daily. Take with food.   ?  SYMBICORT 160-4.5 mcg/actuation aerosol inhaler Inhale 2 puffs by mouth twice daily   ? XARELTO 20 mg tablet TAKE 1 TABLET BY MOUTH ONCE DAILY WITH  SUPPER     Total time spent on today's office visit was 30 minutes.  This includes face-to-face in person visit with patient as well as nonface-to-face time including review of the EMR, outside records, labs, radiologic studies, echocardiogram & other cardiovascular studies, formation of treatment plan, after visit summary, future disposition, and lastly on documentation.

## 2021-01-19 ENCOUNTER — Encounter: Admit: 2021-01-19 | Discharge: 2021-01-19 | Payer: MEDICARE

## 2021-01-19 DIAGNOSIS — I513 Intracardiac thrombosis, not elsewhere classified: Secondary | ICD-10-CM

## 2021-01-19 DIAGNOSIS — E7800 Pure hypercholesterolemia, unspecified: Secondary | ICD-10-CM

## 2021-01-19 DIAGNOSIS — I10 Essential (primary) hypertension: Secondary | ICD-10-CM

## 2021-01-19 DIAGNOSIS — I4819 Other persistent atrial fibrillation: Secondary | ICD-10-CM

## 2021-01-19 DIAGNOSIS — J449 Chronic obstructive pulmonary disease, unspecified: Secondary | ICD-10-CM

## 2021-01-19 DIAGNOSIS — I5022 Chronic systolic (congestive) heart failure: Secondary | ICD-10-CM

## 2021-01-19 DIAGNOSIS — G4734 Idiopathic sleep related nonobstructive alveolar hypoventilation: Secondary | ICD-10-CM

## 2021-01-19 DIAGNOSIS — K219 Gastro-esophageal reflux disease without esophagitis: Secondary | ICD-10-CM

## 2021-01-19 MED ORDER — METOPROLOL SUCCINATE 100 MG PO TB24
ORAL_TABLET | Freq: Every day | 0 refills
Start: 2021-01-19 — End: ?

## 2021-01-21 ENCOUNTER — Encounter: Admit: 2021-01-21 | Discharge: 2021-01-21 | Payer: MEDICARE

## 2021-01-21 DIAGNOSIS — E785 Hyperlipidemia, unspecified: Secondary | ICD-10-CM

## 2021-01-21 DIAGNOSIS — I10 Essential (primary) hypertension: Secondary | ICD-10-CM

## 2021-01-21 DIAGNOSIS — Z5181 Encounter for therapeutic drug level monitoring: Secondary | ICD-10-CM

## 2021-01-21 NOTE — Telephone Encounter
Lab orders placed

## 2021-01-21 NOTE — Telephone Encounter
Patient has appointment with Dr. Reita Cliche in the morning and has fasting lab draw.  Patient wanted to know if he can take medication with water before lab draw. Informed pt, may take meds with water, reminded pt not to eat food or drink juice before lab draw.   Basilio Cairo, RN

## 2021-01-22 ENCOUNTER — Encounter: Admit: 2021-01-22 | Discharge: 2021-01-22 | Payer: MEDICARE

## 2021-01-22 ENCOUNTER — Ambulatory Visit: Admit: 2021-01-22 | Discharge: 2021-01-22 | Payer: MEDICARE

## 2021-01-22 ENCOUNTER — Ambulatory Visit: Admit: 2021-01-22 | Discharge: 2021-01-23 | Payer: MEDICARE

## 2021-01-22 DIAGNOSIS — W57XXXS Bitten or stung by nonvenomous insect and other nonvenomous arthropods, sequela: Secondary | ICD-10-CM

## 2021-01-22 DIAGNOSIS — R002 Palpitations: Secondary | ICD-10-CM

## 2021-01-22 DIAGNOSIS — Z0000 Encounter for general adult medical examination without abnormal findings: Secondary | ICD-10-CM

## 2021-01-22 DIAGNOSIS — Z7901 Long term (current) use of anticoagulants: Secondary | ICD-10-CM

## 2021-01-22 DIAGNOSIS — R198 Other specified symptoms and signs involving the digestive system and abdomen: Secondary | ICD-10-CM

## 2021-01-22 DIAGNOSIS — I10 Essential (primary) hypertension: Secondary | ICD-10-CM

## 2021-01-22 DIAGNOSIS — M549 Dorsalgia, unspecified: Secondary | ICD-10-CM

## 2021-01-22 DIAGNOSIS — I4891 Unspecified atrial fibrillation: Secondary | ICD-10-CM

## 2021-01-22 DIAGNOSIS — Z8679 Personal history of other diseases of the circulatory system: Secondary | ICD-10-CM

## 2021-01-22 DIAGNOSIS — E785 Hyperlipidemia, unspecified: Secondary | ICD-10-CM

## 2021-01-22 DIAGNOSIS — R9439 Abnormal result of other cardiovascular function study: Secondary | ICD-10-CM

## 2021-01-22 DIAGNOSIS — J449 Chronic obstructive pulmonary disease, unspecified: Secondary | ICD-10-CM

## 2021-01-22 DIAGNOSIS — I82409 Acute embolism and thrombosis of unspecified deep veins of unspecified lower extremity: Secondary | ICD-10-CM

## 2021-01-22 DIAGNOSIS — I5022 Chronic systolic (congestive) heart failure: Secondary | ICD-10-CM

## 2021-01-22 DIAGNOSIS — A0472 Enterocolitis due to Clostridium difficile, not specified as recurrent: Secondary | ICD-10-CM

## 2021-01-22 DIAGNOSIS — Z86718 Personal history of other venous thrombosis and embolism: Secondary | ICD-10-CM

## 2021-01-22 DIAGNOSIS — I426 Alcoholic cardiomyopathy: Secondary | ICD-10-CM

## 2021-01-22 DIAGNOSIS — Z5181 Encounter for therapeutic drug level monitoring: Secondary | ICD-10-CM

## 2021-01-22 DIAGNOSIS — I2510 Atherosclerotic heart disease of native coronary artery without angina pectoris: Secondary | ICD-10-CM

## 2021-01-22 DIAGNOSIS — R238 Other skin changes: Secondary | ICD-10-CM

## 2021-01-22 DIAGNOSIS — F419 Anxiety disorder, unspecified: Secondary | ICD-10-CM

## 2021-01-22 LAB — COMPREHENSIVE METABOLIC PANEL
ALT: 13 U/L (ref 7–56)
ANION GAP: 5 (ref 3–12)
CO2: 29 MMOL/L (ref 21–30)
EGFR: >60 mL/min (ref >60)
POTASSIUM: 5.1 MMOL/L (ref <100)
SODIUM: 142 MMOL/L — ABNORMAL LOW (ref >40)

## 2021-01-22 LAB — CBC
HEMATOCRIT: 38 % — ABNORMAL LOW (ref 40–50)
HEMOGLOBIN: 13 g/dL — ABNORMAL LOW (ref 13.5–16.5)
MCH: 29 pg (ref 26–34)
MCHC: 33 g/dL (ref 32.0–36.0)
MCV: 88 FL (ref 80–100)
MPV: 10 FL (ref 7–11)
PLATELET COUNT: 183 K/UL (ref 150–400)
RBC COUNT: 4.3 M/UL — ABNORMAL LOW (ref 4.4–5.5)
RDW: 14 % (ref 11–15)
WBC COUNT: 6.1 K/UL (ref 4.5–11.0)

## 2021-01-22 LAB — TSH WITH FREE T4 REFLEX: TSH: 1.7 uU/mL (ref 0.35–5.00)

## 2021-01-22 LAB — LIPID PROFILE
CHOLESTEROL: 114 mg/dL (ref <200)
TRIGLYCERIDES: 76 mg/dL (ref <150)

## 2021-01-22 LAB — MAGNESIUM: MAGNESIUM: 1.8 mg/dL (ref 1.6–2.6)

## 2021-01-22 NOTE — Progress Notes
Date of Service: 01/22/2021    Jonathan Barr is a 65 y.o. male. DOB: [birth year]   MRN#: [id number]    Subjective:       65 yo male here for an annual wellness visit - hx of htn- hyperlipidemia- afib- COPD  Reviewed hx in epic briefly  reviewed HRA  Health Risk Assessment Questionnaire  Current Care  List of Providers you have seen in the last two years: Dr Danella Maiers, Cardiology  Are you receiving home health?: No  During the past 4 weeks, how would you rate your health in general?: Good    Outside Care  Since your last PCP visit, have you received care outside of [facility]?: No    Physical Activity  Do you exercise or are you physically active?: Yes  How many days a week do you usually exercise or are physically active?: 4  On days when you exercised or were physically active, how many minutes was the activity?: 240  During the past four weeks, what was the hardest physical activity you could do for at least two minutes?: Very heavy    Diet  In the past month, were you worried whether your food would run out before you or your family had money to buy more?: (!) Yes  In the past 7 days, how many times did you eat fast food or junk food or pizza?: 0  In the past 7 days, how many servings of fruits or vegetables did you eat each day?: (!) 2-3  In the past 7 days, how many sodas and sugar sweetened drinks (regular, not diet) did you drink each day?: (!) 3 or more sweet drinks    Smoke/Tobacco Use  Are you currently a smoker?: No    Alcohol Use  Do you drink alcohol?: Yes  Are you Male or Male?: Male  Male: In the last three months, have you had >3 alcoholic beverages in any one day or >14 in any one week?: No    Depression Screen  Little interest or pleasure in doing things: Not at All  Feeling down, depressed or hopeless: Not at All    Pain  How would you rate your pain today?: Very mild pain    Ambulation  Do you use any assistive devices for ambulation?: No    Fall Risk  Does it take you longer than 30 seconds to get up and out of a chair?: No  Have you fallen in the past year?: No  Fall History (last 44mo): No Falls    Motor Vehicle Safety  Do you fasten your seat belt when you are in the car?: Yes    Sun Exposure  Do you protect yourself from the sun? For example, wear sunscreen when outside.: (!) No    Hearing Loss  Do you have trouble hearing the television or radio when others do not?: (!) Yes   Do you have to strain or struggle to hear/understand conversation?: No  Do you use hearing aids?: No    Urinary Incontinence  Have you had urine leakage in the past 6 months?: No  Does your urine leakage negatively impact your daily activities or sleep?: (not recorded)    Cognitive Impairment  During the past 12 months, have you experienced confusion or memory loss that is happening more often or is getting worse?: (!) Yes    Functional Screen  Do you live alone?: No  Do you live at: Home  Can  you drive your own car or travel alone by bus or taxi?: Yes  Can you shop for groceries or clothes without help?: Yes  Can you prepare your own meals?: Yes  Can you do your own housework without help?: Yes  Can you handle your own money without help?: Yes  Do you need help eating, bathing, dressing, or getting around your home?: No  Do you feel safe?: Yes  Does anyone at home hurt you, hit you, or threaten you?: No  Have you ever been the victim of abuse?: No    Home Safety  Does your home have grab bars in the bathroom?: Yes  Does your home have hand rails on stairs and steps?: Yes  Does your home have functioning smoke alarms?: Yes    Advance Directive  Do you have a living will or Advance Directive?: (!) No  Are you interested in discussing the importance of a living will or Advance Directive?: Yes    Dental Screen  Have you had an exam by your dentist in the last year?: (!) No    Vision Screen  Do you have diabetes?: No           Review of Systems   Constitutional: Negative for chills, diaphoresis and fever. Wt about same as last June   HENT: Positive for hearing loss and tinnitus.         No recent URI sxs  Has dentures on top- partial below- not been to the dentist in a few yrs   Eyes:        Has glasses-thinks went to the eye doc last yr   Cardiovascular: Positive for leg swelling and palpitations.        Occ sharp chest pain   Respiratory:        Occ SOB  Can cough up phlegm  Hx of COPD   Hematologic/Lymphatic: Bruises/bleeds easily.   Skin: Negative.    Musculoskeletal: Positive for neck pain.        Has neck pain and stiffness  Gets some exercise   Gastrointestinal: Negative for abdominal pain, constipation, diarrhea, hematochezia, melena, nausea and vomiting.   Genitourinary: Negative for dysuria and hematuria.   Neurological: Positive for headaches.   Psychiatric/Behavioral:        Stress level is ok   Allergic/Immunologic: Positive for environmental allergies.         Objective:     ? acetaminophen SR(+) (TYLENOL) 650 mg tablet Take 650 mg by mouth every 6 hours as needed for Pain.   ? albuterol (VENTOLIN HFA, PROAIR HFA, PROVENTIL HFA) 90 mcg/actuation inhaler Inhale 2 Puffs by mouth into the lungs every 6 hours as needed for Wheezing or Shortness of Breath. Shake well before use.   ? ALPRAZolam (XANAX) 0.5 mg tablet Take one tablet by mouth three times daily as needed for Anxiety.   ? atorvastatin (LIPITOR) 40 mg tablet Take one tablet by mouth daily.   ? COMBIVENT RESPIMAT 20-100 mcg/actuation inhaler INHALE 1 PUFF BY MOUTH 4 TIMES DAILY   ? dofetilide (TIKOSYN) 500 mcg capsule Take 1 capsule by mouth twice daily   ? ketoconazole (NIZORAL) 2 % topical cream Apply to affected area bid until clear then prn   ? L.ACID/L.CASEI/B.BIF/B.LON/FOS (PROBIOTIC BLEND PO) Take 1 tablet by mouth daily.   ? losartan (COZAAR) 50 mg tablet Take one tablet by mouth daily.   ? Magnesium Oxide 500 mg tab Take 500 mg by mouth twice daily.   ? metoprolol  XL (TOPROL XL) 100 mg extended release tablet Take 1 tablet by mouth once daily   ? omeprazole DR (PRILOSEC) 20 mg capsule Take 20 mg by mouth daily.   ? spironolactone (ALDACTONE) 25 mg tablet Take one-half tablet by mouth daily. Take with food.   ? SYMBICORT 160-4.5 mcg/actuation aerosol inhaler Inhale 2 puffs by mouth twice daily   ? XARELTO 20 mg tablet TAKE 1 TABLET BY MOUTH ONCE DAILY WITH  SUPPER     Vitals:    01/22/21 0905   BP: 110/78   BP Source: Arm, Left Upper   Pulse: 58   Temp: 36.6 ?C (97.9 ?F)   Resp: 16   SpO2: 99%   TempSrc: Temporal   PainSc: Zero   Weight: 95 kg (209 lb 8 oz)   Height: 185.4 cm (6' 0.99)     Body mass index is 27.65 kg/m?Marland Kitchen     Physical Exam  Vitals and nursing note reviewed.   Constitutional:       General: He is not in acute distress.     Appearance: Normal appearance. He is well-developed. He is not ill-appearing.   HENT:      Head: Normocephalic and atraumatic.      Right Ear: Tympanic membrane and ear canal normal.      Left Ear: Tympanic membrane and ear canal normal.      Mouth/Throat:      Mouth: Mucous membranes are moist.      Comments: Dentures on top- posterior pharynx clear  Eyes:      Conjunctiva/sclera: Conjunctivae normal.      Pupils: Pupils are equal, round, and reactive to light.   Neck:      Trachea: No tracheal deviation.   Cardiovascular:      Rate and Rhythm: Normal rate and regular rhythm.      Heart sounds: No murmur heard.  Pulmonary:      Effort: Pulmonary effort is normal.      Breath sounds: Normal breath sounds.   Abdominal:      General: Bowel sounds are normal. There is no distension.      Palpations: Abdomen is soft. There is no mass.      Tenderness: There is no abdominal tenderness.   Genitourinary:     Comments: Declined a DRE  Musculoskeletal:      Cervical back: Neck supple.      Comments: Has sock line edema- left more than rt   Lymphadenopathy:      Cervical: No cervical adenopathy.   Skin:     General: Skin is warm and dry.   Neurological:      General: No focal deficit present.      Mental Status: He is alert and oriented to person, place, and time.      Cranial Nerves: No cranial nerve deficit.   Psychiatric:         Mood and Affect: Mood normal.         Behavior: Behavior normal.         Thought Content: Thought content normal.         Judgment: Judgment normal.     a/p  Annual wellness visit- non smoker- needs wt loss- up to date on his colonoscopy- psa due in the fall- declined a DRE- needs a prevnar 20 when available- declined a covid series- flu shot due in the fall- will fill out the Medicare Wellness form and have mailed to the pt  Hx of tick  bites- pt wants to be checked for Lyme disease- testing ordered  htn- good control  Hyperlipidemia- check lab- on lipitor  afib- on tikosyn and toprol xl- check magnesium level- on xarelto- sees cardiology  COPD- cont inhalers- not smoking  rtc in 6 months and prn

## 2021-03-27 ENCOUNTER — Encounter: Admit: 2021-03-27 | Discharge: 2021-03-27 | Payer: MEDICARE

## 2021-05-29 ENCOUNTER — Encounter: Admit: 2021-05-29 | Discharge: 2021-05-29 | Payer: MEDICARE

## 2021-05-29 DIAGNOSIS — R079 Chest pain, unspecified: Secondary | ICD-10-CM

## 2021-05-29 DIAGNOSIS — F419 Anxiety disorder, unspecified: Secondary | ICD-10-CM

## 2021-05-29 MED ORDER — ALPRAZOLAM 0.5 MG PO TAB
.5 mg | ORAL_TABLET | Freq: Three times a day (TID) | ORAL | 5 refills | Status: AC | PRN
Start: 2021-05-29 — End: ?

## 2021-06-02 ENCOUNTER — Encounter: Admit: 2021-06-02 | Discharge: 2021-06-02 | Payer: MEDICARE

## 2021-06-02 NOTE — Telephone Encounter
Pt left message that he feels horrible. Pt has Amoxicillin 500  Mg tabs, from when he had cellulitis in past - Pt questions if this would be helpful to take.    Pt unsure which ER he will go to.  Not confident with [facility] ED.  Argie Ramming, RN      Reason for Disposition  ? [1] MODERATE difficulty breathing (e.g., speaks in phrases, SOB even at rest, pulse 100-120) AND [2] still present when not coughing    Answer Assessment - Initial Assessment Questions  1. ONSET: When did the cough begin?       Worsening over the last week or so  2. SEVERITY: How bad is the cough today?       Coughs with each breath, sounds SOA  3. SPUTUM: Describe the color of your sputum (none, dry cough; clear, white, yellow, green)      some  4. HEMOPTYSIS: Are you coughing up any blood? If so ask: How much? (flecks, streaks, tablespoons, etc.)      no  5. DIFFICULTY BREATHING: Are you having difficulty breathing? If Yes, ask: How bad is it? (e.g., mild, moderate, severe)     - MILD: No SOB at rest, mild SOB with walking, speaks normally in sentences, can lie down, no retractions, pulse < 100.     - MODERATE: SOB at rest, SOB with minimal exertion and prefers to sit, cannot lie down flat, speaks in phrases, mild retractions, audible wheezing, pulse 100-120.     - SEVERE: Very SOB at rest, speaks in single words, struggling to breathe, sitting hunched forward, retractions, pulse > 120       Mod to severe, BP and pulse low - 63/47 p -53, 88/67 p- 87...  6. FEVER: Do you have a fever? If Yes, ask: What is your temperature, how was it measured, and when did it start?      100.5 - 103  7. CARDIAC HISTORY: Do you have any history of heart disease? (e.g., heart attack, congestive heart failure)       yes  8. LUNG HISTORY: Do you have any history of lung disease?  (e.g., pulmonary embolus, asthma, emphysema)      COPD  9. PE RISK FACTORS: Do you have a history of blood clots? (or: recent major surgery, recent prolonged travel, bedridden)      no  10. OTHER SYMPTOMS: Do you have any other symptoms? (e.g., runny nose, wheezing, chest pain)        Wheezing, chills  11. PREGNANCY: Is there any chance you are pregnant? When was your last menstrual period?      NA  12. TRAVEL: Have you traveled out of the country in the last month? (e.g., travel history, exposures)        no    Protocols used: COUGH - ACUTE PRODUCTIVE-A-AH

## 2021-06-02 NOTE — Telephone Encounter
Pt called today c/o due to dizziness fatigue he reports wife had covid in the last 1-2 wks and he was having symptoms last week.  He reports bp today of 78/61 yest 88/69 p87, Sat 62/47 p53, Friday 82/66 p47  He states he will prob go to er for evaluation and tx wants to know what meds he can dec for bp.

## 2021-06-02 NOTE — Telephone Encounter
Spoke with patient, states went to [place] ED-had lab tests, bolus of IV fluids and was advised to drink more water, take vitamin C, zinc and vitamin D. Sent home with Rx for prednisone.   Basilio Cairo, RN

## 2021-06-04 ENCOUNTER — Encounter: Admit: 2021-06-04 | Discharge: 2021-06-04 | Payer: MEDICARE

## 2021-06-04 NOTE — Telephone Encounter
ED Discharge Follow Up  Reached patient: No; LM to contact PCP office with any questions/concerns, and to schedule f/u appt.    Admission Information  Hospital Name : [facility]  ED Admission Date: 06/02/21 ED Discharge Date: 06/02/21 Admission Diagnosis: Hypotension  Discharge Diagnosis : unknown  Hospital Services: Unplanned  Today's call is 2 (calendar) days post discharge    Medication Reconciliation  Changes to pre-ED visit medications? No  Were new prescriptions filled?N/A  Meds reviewed and reconciled? Yes  ? acetaminophen SR(+) (TYLENOL) 650 mg tablet Take 650 mg by mouth every 6 hours as needed for Pain.   ? albuterol (VENTOLIN HFA, PROAIR HFA, PROVENTIL HFA) 90 mcg/actuation inhaler Inhale 2 Puffs by mouth into the lungs every 6 hours as needed for Wheezing or Shortness of Breath. Shake well before use.   ? ALPRAZolam (XANAX) 0.5 mg tablet Take one tablet by mouth three times daily as needed for Anxiety.   ? atorvastatin (LIPITOR) 40 mg tablet Take one tablet by mouth daily.   ? COMBIVENT RESPIMAT 20-100 mcg/actuation inhaler INHALE 1 PUFF BY MOUTH 4 TIMES DAILY   ? dofetilide (TIKOSYN) 500 mcg capsule Take 1 capsule by mouth twice daily   ? ketoconazole (NIZORAL) 2 % topical cream Apply to affected area bid until clear then prn   ? L.ACID/L.CASEI/B.BIF/B.LON/FOS (PROBIOTIC BLEND PO) Take 1 tablet by mouth daily.   ? losartan (COZAAR) 50 mg tablet Take one tablet by mouth daily.   ? Magnesium Oxide 500 mg tab Take 500 mg by mouth twice daily.   ? metoprolol XL (TOPROL XL) 100 mg extended release tablet Take 1 tablet by mouth once daily   ? omeprazole DR (PRILOSEC) 20 mg capsule Take 20 mg by mouth daily.   ? spironolactone (ALDACTONE) 25 mg tablet Take one-half tablet by mouth daily. Take with food.   ? SYMBICORT 160-4.5 mcg/actuation aerosol inhaler Inhale 2 puffs by mouth twice daily   ? XARELTO 20 mg tablet TAKE 1 TABLET BY MOUTH ONCE DAILY WITH  SUPPER       Scheduling Follow-up Appointment  Upcoming appointment date and time and with whom scheduled:   Future Appointments   Date Time Provider Department Center   07/24/2021  2:00 PM Raeanne Gathers, MD [facility]   08/26/2021  2:00 PM Vanice Sarah, MD [facility] Exam     When was patient?s last PCP visit: 01/22/2021  PCP primary location: [facility]  PCP appointment scheduled? No, routine appt 07/24/21  Specialist appointment scheduled? No  Both PCP and Specialist appointment scheduled: No  Is assistance with transportation needed? No  MyChart message sent? Active in MyChart. No message sent.     ED Communication   Did Pt call Clinic prior to going to ED? Yes Referred to ED by clinic/provider  Reason patient went to ED: Referred by clinic/provider    Lavell Islam

## 2021-06-07 ENCOUNTER — Encounter: Admit: 2021-06-07 | Discharge: 2021-06-07 | Payer: MEDICARE

## 2021-06-07 MED ORDER — ATORVASTATIN 40 MG PO TAB
ORAL_TABLET | Freq: Every day | 0 refills
Start: 2021-06-07 — End: ?

## 2021-06-09 ENCOUNTER — Encounter: Admit: 2021-06-09 | Discharge: 2021-06-09 | Payer: MEDICARE

## 2021-06-09 NOTE — Telephone Encounter
Patient sent appt request for SOB with exertion.     Called patient for more information.   Tested positive for covid 1 week ago. Was evaluated in the ED [facility].   States that Sob with exertion has continued since covid dx, but has not worsened.    Denies that SOB is worsening. Denies any chest pain.  No SOB while at rest.   Still has cough as well as some muscle aches and some fatigue.   States that he does not own an oximeter.  Advised patient to purchase an oximeter so that he can better monitor his sxs.     Scheduled appt with Dr Reita Cliche in 2 days.  Advised patient to return to ED or an urgent care if sxs worsened prior to appt on Wednesday.   Will request records from [place].     Any further advice, Dr Reita Cliche?      Natasha Bence, RN

## 2021-06-09 NOTE — Telephone Encounter
No- agree with advice

## 2021-06-11 ENCOUNTER — Ambulatory Visit: Admit: 2021-06-11 | Discharge: 2021-06-11 | Payer: MEDICARE

## 2021-06-11 ENCOUNTER — Encounter: Admit: 2021-06-11 | Discharge: 2021-06-11 | Payer: MEDICARE

## 2021-06-11 DIAGNOSIS — I10 Essential (primary) hypertension: Secondary | ICD-10-CM

## 2021-06-11 DIAGNOSIS — E785 Hyperlipidemia, unspecified: Secondary | ICD-10-CM

## 2021-06-11 DIAGNOSIS — R238 Other skin changes: Secondary | ICD-10-CM

## 2021-06-11 DIAGNOSIS — R9439 Abnormal result of other cardiovascular function study: Secondary | ICD-10-CM

## 2021-06-11 DIAGNOSIS — I2510 Atherosclerotic heart disease of native coronary artery without angina pectoris: Secondary | ICD-10-CM

## 2021-06-11 DIAGNOSIS — F419 Anxiety disorder, unspecified: Secondary | ICD-10-CM

## 2021-06-11 DIAGNOSIS — I82409 Acute embolism and thrombosis of unspecified deep veins of unspecified lower extremity: Secondary | ICD-10-CM

## 2021-06-11 DIAGNOSIS — J441 Chronic obstructive pulmonary disease with (acute) exacerbation: Secondary | ICD-10-CM

## 2021-06-11 DIAGNOSIS — R198 Other specified symptoms and signs involving the digestive system and abdomen: Secondary | ICD-10-CM

## 2021-06-11 DIAGNOSIS — R002 Palpitations: Secondary | ICD-10-CM

## 2021-06-11 DIAGNOSIS — A0472 Enterocolitis due to Clostridium difficile, not specified as recurrent: Secondary | ICD-10-CM

## 2021-06-11 DIAGNOSIS — M549 Dorsalgia, unspecified: Secondary | ICD-10-CM

## 2021-06-11 DIAGNOSIS — I4891 Unspecified atrial fibrillation: Secondary | ICD-10-CM

## 2021-06-11 DIAGNOSIS — Z8679 Personal history of other diseases of the circulatory system: Secondary | ICD-10-CM

## 2021-06-11 DIAGNOSIS — I5022 Chronic systolic (congestive) heart failure: Secondary | ICD-10-CM

## 2021-06-11 DIAGNOSIS — R7989 Other specified abnormal findings of blood chemistry: Secondary | ICD-10-CM

## 2021-06-11 DIAGNOSIS — Z7901 Long term (current) use of anticoagulants: Secondary | ICD-10-CM

## 2021-06-11 DIAGNOSIS — J449 Chronic obstructive pulmonary disease, unspecified: Secondary | ICD-10-CM

## 2021-06-11 DIAGNOSIS — Z86718 Personal history of other venous thrombosis and embolism: Secondary | ICD-10-CM

## 2021-06-11 DIAGNOSIS — I426 Alcoholic cardiomyopathy: Secondary | ICD-10-CM

## 2021-06-11 MED ORDER — PREDNISONE 10 MG PO TAB
ORAL_TABLET | Freq: Every day | 0 refills | Status: AC
Start: 2021-06-11 — End: ?

## 2021-06-11 NOTE — Progress Notes
Date of Service: 06/11/2021    Jonathan Barr is a 64 y.o. male. DOB: 07/27/1956   MRN#: [id number]    Subjective:       65 yo male here for f/u of a recent ED visit in [place] on 06-02-21- dxed with covid - reviewed his ED records- will have scanned into his chart  His wife had tested positive the week before  Pt's sxs started on 10-14 or 10-15   Had IVF at the ER - labs and a cxr  The cxr was negative- his cr was elevated at 1.91- bp been low  Hx of afib- copd- htn- hyperlipidemia- GERD- reviewed hx in epic briefly           Review of Systems   Constitutional:        Had a fever initially - none this week  Had chills and sweats - they have resolved  Wt down 8 lbs since June   Cardiovascular: Positive for palpitations. Negative for chest pain and leg swelling.        Pt on xarelto  Noted more palpitations recently   Respiratory:        Still has a cough- is better  The cough is dry  Some SOB  Given a prednisone rx in the ED- 50 mg per day x 1 week- done with rx   Hematologic/Lymphatic: Bruises/bleeds easily.   Musculoskeletal:        Having some myalgias   Gastrointestinal: Positive for constipation. Negative for abdominal pain, hematochezia, melena, nausea and vomiting.   Genitourinary: Negative for dysuria and hematuria.   Neurological: Positive for headaches.         Objective:     ? acetaminophen SR(+) (TYLENOL) 650 mg tablet Take 650 mg by mouth every 6 hours as needed for Pain.   ? albuterol (VENTOLIN HFA, PROAIR HFA, PROVENTIL HFA) 90 mcg/actuation inhaler Inhale 2 Puffs by mouth into the lungs every 6 hours as needed for Wheezing or Shortness of Breath. Shake well before use.   ? ALPRAZolam (XANAX) 0.5 mg tablet Take one tablet by mouth three times daily as needed for Anxiety.   ? atorvastatin (LIPITOR) 40 mg tablet Take 1 tablet by mouth once daily   ? COMBIVENT RESPIMAT 20-100 mcg/actuation inhaler INHALE 1 PUFF BY MOUTH 4 TIMES DAILY   ? dofetilide (TIKOSYN) 500 mcg capsule Take 1 capsule by mouth twice daily   ? ketoconazole (NIZORAL) 2 % topical cream Apply to affected area bid until clear then prn   ? L.ACID/L.CASEI/B.BIF/B.LON/FOS (PROBIOTIC BLEND PO) Take 1 tablet by mouth daily.   ? losartan (COZAAR) 50 mg tablet Take one tablet by mouth daily.   ? Magnesium Oxide 500 mg tab Take 500 mg by mouth twice daily.   ? metoprolol XL (TOPROL XL) 100 mg extended release tablet Take 1 tablet by mouth once daily   ? omeprazole DR (PRILOSEC) 20 mg capsule Take 20 mg by mouth daily.   ? spironolactone (ALDACTONE) 25 mg tablet Take one-half tablet by mouth daily. Take with food.   ? SYMBICORT 160-4.5 mcg/actuation aerosol inhaler Inhale 2 puffs by mouth twice daily   ? XARELTO 20 mg tablet TAKE 1 TABLET BY MOUTH ONCE DAILY WITH  SUPPER     Vitals:    06/11/21 1523   BP: 98/66   BP Source: Arm, Left Upper   Pulse: 92   Temp: 36.7 ?C (98 ?F)   SpO2: 97%   TempSrc: Temporal   PainSc:  Zero   Weight: 91.2 kg (201 lb)   Height: 185.4 cm (6' 0.99)     Body mass index is 26.52 kg/m?Marland Kitchen     Physical Exam  Vitals and nursing note reviewed.   Constitutional:       General: He is not in acute distress.     Appearance: Normal appearance. He is well-developed. He is not ill-appearing.   HENT:      Head: Normocephalic and atraumatic.      Right Ear: Tympanic membrane and ear canal normal.      Left Ear: Tympanic membrane and ear canal normal.      Mouth/Throat:      Mouth: Mucous membranes are moist.   Eyes:      Conjunctiva/sclera: Conjunctivae normal.      Pupils: Pupils are equal, round, and reactive to light.   Neck:      Trachea: No tracheal deviation.   Cardiovascular:      Rate and Rhythm: Normal rate and regular rhythm.      Heart sounds: No murmur heard.  Pulmonary:      Effort: Pulmonary effort is normal.      Breath sounds: Wheezing present.   Abdominal:      General: Bowel sounds are normal. There is no distension.      Palpations: Abdomen is soft. There is no mass.      Tenderness: There is no abdominal tenderness. Musculoskeletal:      Cervical back: Neck supple.      Right lower leg: No edema.      Left lower leg: No edema.   Lymphadenopathy:      Cervical: No cervical adenopathy.   Skin:     General: Skin is warm and dry.   Neurological:      General: No focal deficit present.      Mental Status: He is alert and oriented to person, place, and time.      Cranial Nerves: No cranial nerve deficit.   Psychiatric:         Mood and Affect: Mood normal.         Behavior: Behavior normal.         Thought Content: Thought content normal.         Judgment: Judgment normal.       A/p  COPD exacerbation due to covid 19- not coughing up purulent sputum- CXR neg on 06-02-21-did not order an antibiotic but asked pt to call us back if develops purulent sputum-- already had a week of prednisone- still wheezing- use symbicort 2 puffs bid- use combivent 1 puff qid- albuterol neb q4 hrs - ordered more prednisone   htn - bp low- decrease losartan from 50 mg per day to 25 mg per day  Elevated cr - was 1.91 on 10-17-his baseline is around 1.3- increase fluid intake- repeat chem at next visit in Dec  Flu shot due when off prednisone x 2 weeks  rtc at already scheduled appt in Dec  Visit time 30 minutes or more spent in face- to - face time- chart review and documentation

## 2021-06-12 ENCOUNTER — Encounter: Admit: 2021-06-12 | Discharge: 2021-06-12 | Payer: MEDICARE

## 2021-06-26 ENCOUNTER — Encounter: Admit: 2021-06-26 | Discharge: 2021-06-26 | Payer: MEDICARE

## 2021-07-08 ENCOUNTER — Encounter: Admit: 2021-07-08 | Discharge: 2021-07-08 | Payer: MEDICARE

## 2021-07-08 DIAGNOSIS — F419 Anxiety disorder, unspecified: Secondary | ICD-10-CM

## 2021-07-08 DIAGNOSIS — R079 Chest pain, unspecified: Secondary | ICD-10-CM

## 2021-07-08 MED ORDER — ALPRAZOLAM 0.5 MG PO TAB
ORAL_TABLET | Freq: Three times a day (TID) | 0 refills | Status: AC | PRN
Start: 2021-07-08 — End: ?

## 2021-07-30 ENCOUNTER — Encounter: Admit: 2021-07-30 | Discharge: 2021-07-30 | Payer: MEDICARE

## 2021-07-30 MED ORDER — SYMBICORT 160-4.5 MCG/ACTUATION IN HFAA
Freq: Two times a day (BID) | RESPIRATORY_TRACT | 0 refills | 30.00000 days | Status: AC
Start: 2021-07-30 — End: ?

## 2021-08-08 ENCOUNTER — Encounter: Admit: 2021-08-08 | Discharge: 2021-08-08 | Payer: MEDICARE

## 2021-08-08 MED ORDER — SPIRONOLACTONE 25 MG PO TAB
ORAL_TABLET | Freq: Every day | ORAL | 3 refills | 90.00000 days | Status: AC
Start: 2021-08-08 — End: ?

## 2021-08-20 ENCOUNTER — Encounter: Admit: 2021-08-20 | Discharge: 2021-08-20 | Payer: MEDICARE

## 2021-08-26 ENCOUNTER — Encounter: Admit: 2021-08-26 | Discharge: 2021-08-26 | Payer: MEDICARE

## 2021-08-26 DIAGNOSIS — Z7901 Long term (current) use of anticoagulants: Secondary | ICD-10-CM

## 2021-08-26 DIAGNOSIS — I82409 Acute embolism and thrombosis of unspecified deep veins of unspecified lower extremity: Secondary | ICD-10-CM

## 2021-08-26 DIAGNOSIS — A0472 Enterocolitis due to Clostridium difficile, not specified as recurrent: Secondary | ICD-10-CM

## 2021-08-26 DIAGNOSIS — I5022 Chronic systolic (congestive) heart failure: Secondary | ICD-10-CM

## 2021-08-26 DIAGNOSIS — Z8679 Personal history of other diseases of the circulatory system: Secondary | ICD-10-CM

## 2021-08-26 DIAGNOSIS — I426 Alcoholic cardiomyopathy: Secondary | ICD-10-CM

## 2021-08-26 DIAGNOSIS — R079 Chest pain, unspecified: Secondary | ICD-10-CM

## 2021-08-26 DIAGNOSIS — R0989 Other specified symptoms and signs involving the circulatory and respiratory systems: Secondary | ICD-10-CM

## 2021-08-26 DIAGNOSIS — J449 Chronic obstructive pulmonary disease, unspecified: Secondary | ICD-10-CM

## 2021-08-26 DIAGNOSIS — R238 Other skin changes: Secondary | ICD-10-CM

## 2021-08-26 DIAGNOSIS — I480 Paroxysmal atrial fibrillation: Secondary | ICD-10-CM

## 2021-08-26 DIAGNOSIS — R002 Palpitations: Secondary | ICD-10-CM

## 2021-08-26 DIAGNOSIS — F419 Anxiety disorder, unspecified: Secondary | ICD-10-CM

## 2021-08-26 DIAGNOSIS — I2510 Atherosclerotic heart disease of native coronary artery without angina pectoris: Secondary | ICD-10-CM

## 2021-08-26 DIAGNOSIS — I4891 Unspecified atrial fibrillation: Secondary | ICD-10-CM

## 2021-08-26 DIAGNOSIS — I10 Essential (primary) hypertension: Secondary | ICD-10-CM

## 2021-08-26 DIAGNOSIS — R198 Other specified symptoms and signs involving the digestive system and abdomen: Secondary | ICD-10-CM

## 2021-08-26 DIAGNOSIS — M542 Cervicalgia: Secondary | ICD-10-CM

## 2021-08-26 DIAGNOSIS — E785 Hyperlipidemia, unspecified: Secondary | ICD-10-CM

## 2021-08-26 DIAGNOSIS — R9439 Abnormal result of other cardiovascular function study: Secondary | ICD-10-CM

## 2021-08-26 DIAGNOSIS — M47812 Spondylosis without myelopathy or radiculopathy, cervical region: Secondary | ICD-10-CM

## 2021-08-26 DIAGNOSIS — Z86718 Personal history of other venous thrombosis and embolism: Secondary | ICD-10-CM

## 2021-08-26 DIAGNOSIS — M549 Dorsalgia, unspecified: Secondary | ICD-10-CM

## 2021-08-26 MED ORDER — ALPRAZOLAM 0.5 MG PO TAB
.5 mg | ORAL_TABLET | Freq: Three times a day (TID) | ORAL | 0 refills | Status: AC | PRN
Start: 2021-08-26 — End: ?
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
## 2021-08-26 NOTE — Assessment & Plan Note
He pretty much stays off alcohol entirely and his left ventricular function has normalized.  He had a normal ejection fraction on the last echocardiogram in 2019.

## 2021-08-26 NOTE — Assessment & Plan Note
He has done well on the combination of Tikosyn and Xarelto.  The QT interval on today's EKG looks fine.  I suspect that the nocturnal palpitations he is having are simply PACs or PVCs and did not recommend a cardiac monitor today.
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
## 2021-08-28 ENCOUNTER — Encounter: Admit: 2021-08-28 | Discharge: 2021-08-28 | Payer: MEDICARE

## 2021-08-28 DIAGNOSIS — G4734 Idiopathic sleep related nonobstructive alveolar hypoventilation: Secondary | ICD-10-CM

## 2021-08-28 DIAGNOSIS — E7800 Pure hypercholesterolemia, unspecified: Secondary | ICD-10-CM

## 2021-08-28 DIAGNOSIS — J449 Chronic obstructive pulmonary disease, unspecified: Secondary | ICD-10-CM

## 2021-08-28 DIAGNOSIS — K219 Gastro-esophageal reflux disease without esophagitis: Secondary | ICD-10-CM

## 2021-08-28 DIAGNOSIS — I4819 Other persistent atrial fibrillation: Secondary | ICD-10-CM

## 2021-08-28 DIAGNOSIS — I10 Essential (primary) hypertension: Secondary | ICD-10-CM

## 2021-08-28 DIAGNOSIS — I513 Intracardiac thrombosis, not elsewhere classified: Secondary | ICD-10-CM

## 2021-08-28 DIAGNOSIS — I5022 Chronic systolic (congestive) heart failure: Secondary | ICD-10-CM

## 2021-08-28 MED ORDER — DOFETILIDE 500 MCG PO CAP
ORAL_CAPSULE | Freq: Two times a day (BID) | 3 refills | Status: AC
Start: 2021-08-28 — End: ?

## 2021-09-13 ENCOUNTER — Encounter: Admit: 2021-09-13 | Discharge: 2021-09-13 | Payer: MEDICARE

## 2021-09-13 MED ORDER — XARELTO 20 MG PO TAB
ORAL_TABLET | Freq: Every day | 0 refills
Start: 2021-09-13 — End: ?

## 2021-11-05 ENCOUNTER — Encounter: Admit: 2021-11-05 | Discharge: 2021-11-05 | Payer: MEDICARE

## 2021-11-05 MED ORDER — COMBIVENT RESPIMAT 20-100 MCG/ACTUATION IN MIST
RESPIRATORY_TRACT | 0 refills | 30.00000 days | Status: AC
Start: 2021-11-05 — End: ?

## 2021-12-03 ENCOUNTER — Encounter: Admit: 2021-12-03 | Discharge: 2021-12-03 | Payer: MEDICARE

## 2021-12-03 MED ORDER — LOSARTAN 50 MG PO TAB
ORAL_TABLET | ORAL | 3 refills | 90.00000 days | Status: AC
Start: 2021-12-03 — End: ?

## 2021-12-15 ENCOUNTER — Encounter: Admit: 2021-12-15 | Discharge: 2021-12-15 | Payer: MEDICARE

## 2021-12-15 NOTE — Telephone Encounter
Pt LVM stating was just at [facility] and had some tests done. Filled out a request to release med rec and send the last 5 years of medical records to Dr. Reita Cliche. Pt was informed may have Hep C from the blood work done, had a CT scan- seen spot in the lungs. Had an xray in 2019 and was also informed there was a spot in the lungs noted- wanted to have it compared- make sure it's not growing. Had all records sent to Dr. Reita Cliche- just calling to give the office a heads up.    Attempted to call pt. LM if pt would like to schedule a f/u appt with Dr. Reita Cliche to discuss results mentioned above. Provided scheduling number at [phone] and a call back number for any questions or to schedule as well.    Unsure when med rec from [facility] will get uploaded to pt's chart. LOV 06/11/21    Basilio Cairo, RN

## 2021-12-15 NOTE — Telephone Encounter
noted 

## 2022-02-23 ENCOUNTER — Encounter: Admit: 2022-02-23 | Discharge: 2022-02-23 | Payer: MEDICARE

## 2022-02-24 ENCOUNTER — Encounter: Admit: 2022-02-24 | Discharge: 2022-02-24 | Payer: MEDICARE

## 2022-02-24 DIAGNOSIS — E785 Hyperlipidemia, unspecified: Secondary | ICD-10-CM

## 2022-02-24 DIAGNOSIS — Z8679 Personal history of other diseases of the circulatory system: Secondary | ICD-10-CM

## 2022-02-24 DIAGNOSIS — Z7901 Long term (current) use of anticoagulants: Secondary | ICD-10-CM

## 2022-02-24 DIAGNOSIS — I10 Essential (primary) hypertension: Secondary | ICD-10-CM

## 2022-02-24 DIAGNOSIS — F419 Anxiety disorder, unspecified: Secondary | ICD-10-CM

## 2022-02-24 DIAGNOSIS — J449 Chronic obstructive pulmonary disease, unspecified: Secondary | ICD-10-CM

## 2022-02-24 DIAGNOSIS — I426 Alcoholic cardiomyopathy: Secondary | ICD-10-CM

## 2022-02-24 DIAGNOSIS — R238 Other skin changes: Secondary | ICD-10-CM

## 2022-02-24 DIAGNOSIS — I5022 Chronic systolic (congestive) heart failure: Secondary | ICD-10-CM

## 2022-02-24 DIAGNOSIS — M549 Dorsalgia, unspecified: Secondary | ICD-10-CM

## 2022-02-24 DIAGNOSIS — I4819 Other persistent atrial fibrillation: Secondary | ICD-10-CM

## 2022-02-24 DIAGNOSIS — G4734 Idiopathic sleep related nonobstructive alveolar hypoventilation: Secondary | ICD-10-CM

## 2022-02-24 DIAGNOSIS — R079 Chest pain, unspecified: Secondary | ICD-10-CM

## 2022-02-24 DIAGNOSIS — R198 Other specified symptoms and signs involving the digestive system and abdomen: Secondary | ICD-10-CM

## 2022-02-24 DIAGNOSIS — R002 Palpitations: Secondary | ICD-10-CM

## 2022-02-24 DIAGNOSIS — E7800 Pure hypercholesterolemia, unspecified: Secondary | ICD-10-CM

## 2022-02-24 DIAGNOSIS — I513 Intracardiac thrombosis, not elsewhere classified: Secondary | ICD-10-CM

## 2022-02-24 DIAGNOSIS — I2510 Atherosclerotic heart disease of native coronary artery without angina pectoris: Secondary | ICD-10-CM

## 2022-02-24 DIAGNOSIS — I480 Paroxysmal atrial fibrillation: Secondary | ICD-10-CM

## 2022-02-24 DIAGNOSIS — R9439 Abnormal result of other cardiovascular function study: Secondary | ICD-10-CM

## 2022-02-24 DIAGNOSIS — R0989 Other specified symptoms and signs involving the circulatory and respiratory systems: Secondary | ICD-10-CM

## 2022-02-24 DIAGNOSIS — A0472 Enterocolitis due to Clostridium difficile, not specified as recurrent: Secondary | ICD-10-CM

## 2022-02-24 DIAGNOSIS — I82409 Acute embolism and thrombosis of unspecified deep veins of unspecified lower extremity: Secondary | ICD-10-CM

## 2022-02-24 DIAGNOSIS — K219 Gastro-esophageal reflux disease without esophagitis: Secondary | ICD-10-CM

## 2022-02-24 DIAGNOSIS — Z86718 Personal history of other venous thrombosis and embolism: Secondary | ICD-10-CM

## 2022-02-24 DIAGNOSIS — I4891 Unspecified atrial fibrillation: Secondary | ICD-10-CM

## 2022-02-24 MED ORDER — METOPROLOL SUCCINATE 100 MG PO TB24
100 mg | ORAL_TABLET | Freq: Every day | ORAL | 3 refills | 90.00000 days | Status: AC
Start: 2022-02-24 — End: ?

## 2022-02-24 MED ORDER — ALPRAZOLAM 0.5 MG PO TAB
.5 mg | ORAL_TABLET | Freq: Three times a day (TID) | ORAL | 0 refills | Status: AC | PRN
Start: 2022-02-24 — End: ?

## 2022-02-24 NOTE — Assessment & Plan Note
Lab Results   Component Value Date    CHOL 114 01/22/2021    TRIG 76 01/22/2021    HDL 36 (L) 01/22/2021    LDL 72 01/22/2021    VLDL 15 01/22/2021    NONHDLCHOL 78 01/22/2021    CHOLHDLC 4.0 11/19/2017      LDL very close to goal.

## 2022-02-24 NOTE — Assessment & Plan Note
Blood pressure looks fine on the current medical program.

## 2022-02-24 NOTE — Assessment & Plan Note
The QTc on today's EKG looks fine on the current dose of dofetilide.  The drug seems to be effective in preventing recurrence of atrial fibrillation.  His avoidance of significant alcohol consumption certainly helps as well.  He tolerates Xarelto without any bleeding complications.

## 2022-02-24 NOTE — Progress Notes
Date of Service: 02/24/2022    Jonathan Barr is a 66 y.o. male.       HPI     Jonathan Barr was in the [place] clinic today for follow-up regarding paroxysmal atrial fibrillation.  He seems to be having a good summer and he is rebuilding some part of his old [place] but I do not quite understand but it sounds pretty complicated!    He denies any problems with chest discomfort or breathlessness.  He has had no palpitations to suggest recurrent atrial fibrillation.    He drinks a beer about once a week when they go have [place] food but otherwise he seems to be avoiding alcohol.  He denies any TIA or stroke symptoms.         Vitals:    02/24/22 0925   BP: 132/80   BP Source: Arm, Left Upper   Pulse: 68   SpO2: 97%   O2 Device: None (Room air)   PainSc: Zero   Weight: 91.4 kg (201 lb 9.6 oz)   Height: 185.4 cm (6' 1)     Body mass index is 26.6 kg/m?Marland Kitchen     Past Medical History  Patient Active Problem List    Diagnosis Date Noted   ? Cervical arthritis 08/26/2021   ? Cellulitis of left leg 07/12/2018   ? AKI (acute kidney injury) (HCC) 07/12/2018   ? History of DVT (deep vein thrombosis) 07/12/2018   ? Chronic anticoagulation, on rivaroxiban 07/12/2018   ? Atrial fibrillation with rapid ventricular response (HCC) 07/11/2018   ? Sepsis (HCC) 07/11/2018   ? Traumatic hematoma of knee 05/31/2018   ? 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 Umbilical hernia 03/30/2016     Added automatically from request for surgery [id number]     ? Paroxysmal atrial fibrillation (HCC) 12/20/2015     02/22/01 Holter:  Predominant SR, Rare VPD's with bigeminy.  Rare non-sustained SVT.  Rare APD's    12/20/15 TEE with evidence of LA thrombus. Canceled DCCV. Digoxin loaded.  12/2015 - Successful TEE-cardioversion.  Started on Tikosyn.     ? COPD (chronic obstructive pulmonary disease) (HCC) 12/18/2015   ? Nocturnal oxygen desaturation - O2 use 12/18/2015   ? Colon polyp 02/07/2013     12/2012 - Colonoscopy     ? GERD (gastroesophageal reflux disease) 02/07/2013     12/2012- EGD negative for Barrett's     ? Pulmonary nodule 02/07/2013     12/2012 - CT-chest 4.7 mm nodular opacity of peripheral left lower lobe.  F/U CT recommended in 6 months.     ? Essential hypertension 04/24/2011   ? History of tobacco use, quit in 2012 04/24/2011   ? History of alcoholic cardiomyopathy      EF  48%  per stress echo 02/29/00.    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  Normal study.     ? Anxiety    ? Chest pain 09/24/2006        a. Stress Echo 02/29/00 with mild resting global LV asyneresis. Induced inf and post wall myocardial ischemia      b. 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      c.  Cardiac cath 2/08 - Normal coronaries, normal LV function       ? Hyperlipidemia  Review of Systems   Constitutional: Negative.   HENT: Negative.    Eyes: Negative.    Cardiovascular: Negative.    Respiratory: Negative.    Endocrine: Negative.    Hematologic/Lymphatic: Negative.    Skin: Negative.    Musculoskeletal: Negative.    Gastrointestinal: Negative.    Genitourinary: Negative.    Neurological: Negative.    Psychiatric/Behavioral: Negative.    Allergic/Immunologic: Negative.        Physical Exam    Physical Exam   General Appearance: no distress   Skin: warm, no ulcers or xanthomas   Digits and Nails: no cyanosis or clubbing   Eyes: conjunctivae and lids normal, pupils are equal and round   Teeth/Gums/Palate: dentition unremarkable, no lesions   Lips & Oral Mucosa: no pallor or cyanosis   Neck Veins: normal JVP , neck veins are not distended   Thyroid: no nodules, masses, tenderness or enlargement   Chest Inspection: chest is normal in appearance   Respiratory Effort: breathing comfortably, no respiratory distress   Auscultation/Percussion: lungs clear to auscultation, no rales or rhonchi, no wheezing   PMI: PMI not enlarged or displaced   Cardiac Rhythm: regular rhythm and normal rate   Cardiac Auscultation: S1, S2 normal, no rub, no gallop   Murmurs: no murmur   Peripheral Circulation: normal peripheral circulation   Carotid Arteries: normal carotid upstroke bilaterally, no bruits   Radial Arteries: normal symmetric radial pulses   Abdominal Aorta: no abdominal aortic bruit   Pedal Pulses: normal symmetric pedal pulses   Lower Extremity Edema: no lower extremity edema   Abdominal Exam: soft, non-tender, no masses, bowel sounds normal   Liver & Spleen: no organomegaly   Gait & Station: walks without assistance   Muscle Strength: normal muscle tone   Orientation: oriented to time, place and person   Affect & Mood: appropriate and sustained affect   Language and Memory: patient responsive and seems to comprehend information   Neurologic Exam: neurological assessment grossly intact   Other: moves all extremities      Cardiovascular Studies    EKG:  SR, rate .  QT 422 msec.    Cardiovascular Health Factors  Vitals BP Readings from Last 3 Encounters:   02/24/22 132/80   08/26/21 126/80   06/11/21 98/66     Wt Readings from Last 3 Encounters:   02/24/22 91.4 kg (201 lb 9.6 oz)   08/26/21 93.4 kg (205 lb 12.8 oz)   06/11/21 91.2 kg (201 lb)     BMI Readings from Last 3 Encounters:   02/24/22 26.60 kg/m?   08/26/21 27.15 kg/m?   06/11/21 26.52 kg/m?      Smoking Social History     Tobacco Use   Smoking Status Former   ? Packs/day: 0.50   ? Years: 30.00   ? Pack years: 15.00   ? Types: Cigarettes   ? Quit date: 12/06/2010   ? Years since quitting: 11.2   Smokeless Tobacco Never   Tobacco Comments    cessation counseling given 07/15/10      Lipid Profile Cholesterol   Date Value Ref Range Status   01/22/2021 114 <200 MG/DL Final     HDL   Date Value Ref Range Status   01/22/2021 36 (L) >40 MG/DL Final     LDL   Date Value Ref Range Status   01/22/2021 72 <100 mg/dL Final     Triglycerides   Date Value Ref Range Status   01/22/2021 76 <  150 MG/DL Final      Blood Sugar Hemoglobin A1C   Date Value Ref Range Status   07/24/2016 6.0 4.0 - 6.0 % Final     Comment:     The ADA recommends that most patients with type 1 and type 2 diabetes maintain   an A1c level <7%.       Glucose   Date Value Ref Range Status   12/10/2021 104  Final   06/02/2021 96  Final   01/22/2021 92 70 - 100 MG/DL Final     Glucose, POC   Date Value Ref Range Status   07/11/2018 77 70 - 100 MG/DL Final   16/05/9603 65 (L) 70 - 100 MG/DL Final          Problems Addressed Today  Encounter Diagnoses   Name Primary?   ? Cardiovascular symptoms Yes   ? Pure hypercholesterolemia    ? Essential hypertension    ? Gastroesophageal reflux disease    ? Persistent atrial fibrillation (HCC)    ? Chronic systolic heart failure (HCC)    ? Thrombus of left atrial appendage    ? Chronic obstructive pulmonary disease, unspecified COPD type (HCC)    ? Nocturnal oxygen desaturation - O2 use    ? Chest pain, unspecified type    ? Anxiety    ? Paroxysmal atrial fibrillation (HCC)        Assessment and Plan       Paroxysmal atrial fibrillation (HCC)  The QTc on today's EKG looks fine on the current dose of dofetilide.  The drug seems to be effective in preventing recurrence of atrial fibrillation.  His avoidance of significant alcohol consumption certainly helps as well.  He tolerates Xarelto without any bleeding complications.    Essential hypertension  Blood pressure looks fine on the current medical program.    Hyperlipidemia  Lab Results   Component Value Date    CHOL 114 01/22/2021    TRIG 76 01/22/2021    HDL 36 (L) 01/22/2021    LDL 72 01/22/2021    VLDL 15 01/22/2021    NONHDLCHOL 78 01/22/2021    CHOLHDLC 4.0 11/19/2017      LDL very close to goal.      Current Medications (including today's revisions)  ? acetaminophen SR(+) (TYLENOL) 650 mg tablet Take one tablet by mouth every 6 hours as needed for Pain.   ? albuterol (VENTOLIN HFA, PROAIR HFA, PROVENTIL HFA) 90 mcg/actuation inhaler Inhale two puffs by mouth into the lungs every 6 hours as needed for Wheezing or Shortness of Breath. Shake well before use.   ? ALPRAZolam (XANAX) 0.5 mg tablet Take one tablet by mouth three times daily as needed.   ? atorvastatin (LIPITOR) 40 mg tablet Take 1 tablet by mouth once daily   ? dofetilide (TIKOSYN) 500 mcg capsule Take 1 capsule by mouth twice daily   ? L.ACID/L.CASEI/B.BIF/B.LON/FOS (PROBIOTIC BLEND PO) Take 1 tablet by mouth daily.   ? losartan (COZAAR) 50 mg tablet Take 1 tablet by mouth once daily   ? Magnesium Oxide 500 mg tab Take one tablet by mouth twice daily.   ? metoprolol succinate XL (TOPROL XL) 100 mg extended release tablet Take one tablet by mouth daily.   ? omeprazole DR (PRILOSEC) 20 mg capsule Take one capsule by mouth daily.   ? spironolactone (ALDACTONE) 25 mg tablet TAKE 1/2 (ONE-HALF) TABLET BY MOUTH ONCE DAILY WITH FOOD   ? SYMBICORT 160-4.5 mcg/actuation aerosol inhaler Inhale  2 puffs by mouth twice daily   ? XARELTO 20 mg tablet TAKE 1 TABLET BY MOUTH ONCE DAILY WITH SUPPER     Total time spent on today's office visit was 30 minutes.  This includes face-to-face in person visit with patient as well as nonface-to-face time including review of the EMR, outside records, labs, radiologic studies, echocardiogram & other cardiovascular studies, formation of treatment plan, after visit summary, future disposition, and lastly on documentation.

## 2022-03-31 ENCOUNTER — Encounter: Admit: 2022-03-31 | Discharge: 2022-03-31 | Payer: MEDICARE

## 2022-03-31 DIAGNOSIS — R079 Chest pain, unspecified: Secondary | ICD-10-CM

## 2022-03-31 DIAGNOSIS — F419 Anxiety disorder, unspecified: Secondary | ICD-10-CM

## 2022-03-31 MED ORDER — ALPRAZOLAM 0.5 MG PO TAB
.5 mg | ORAL_TABLET | Freq: Three times a day (TID) | ORAL | 0 refills | Status: AC | PRN
Start: 2022-03-31 — End: ?

## 2022-03-31 NOTE — Telephone Encounter
-----   Message from Rogelia Boga, RN sent at 03/27/2022  3:45 PM CDT -----  Regarding: xanax refills  Hi friend!    [place] called and said you were working on getting him refills from [facility] on his xanax. It was sent in on 7/11 with no refills.  If you are not, let me know and I will happily send SDO a note asking for recommendations, but I thought you might already be on it.    Thanks buddy!  Asher Muir

## 2022-04-03 ENCOUNTER — Encounter: Admit: 2022-04-03 | Discharge: 2022-04-03 | Payer: MEDICARE

## 2022-04-03 NOTE — Telephone Encounter
-----   Message from Vanice Sarah, MD sent at 04/03/2022  7:10 AM CDT -----  Regarding: RE: referral to spine center at [place]  Can we refer this back to his primary, Dr. Reita Cliche?  ----- Message -----  From: Weston Brass  Sent: 03/31/2022   3:08 PM CDT  To: Vanice Sarah, MD  Subject: referral to spine center at [place]                   Patient will need MRI or CT myelogram before we can schedule appointment for neurosurgery.    Let me know what you want to order.    Thanks   Brett Canales

## 2022-04-08 ENCOUNTER — Encounter: Admit: 2022-04-08 | Discharge: 2022-04-08 | Payer: MEDICARE

## 2022-04-08 DIAGNOSIS — M47812 Spondylosis without myelopathy or radiculopathy, cervical region: Secondary | ICD-10-CM

## 2022-04-08 NOTE — Telephone Encounter
-----   Message from Steven D Owens, MD sent at 04/03/2022  7:10 AM CDT -----  Regarding: RE: referral to spine center at [place]  Can we refer this back to his primary, Dr. Weinhold?  ----- Message -----  From: Varina Hulon  Sent: 03/31/2022   3:08 PM CDT  To: Steven D Owens, MD  Subject: referral to spine center at [place]                   Patient will need MRI or CT myelogram before we can schedule appointment for neurosurgery.    Let me know what you want to order.    Thanks   Steve

## 2022-04-08 NOTE — Telephone Encounter
Attempted to call patient. Left message on cell-signed authorization on file regarding receiving a message from patient's cardiologist asking the office to order an MRI as patient was referred to the spine center- MRI needed prior to being seen. Left message that Dr. Reita Cliche would like for patient to come in to assess patient's symptoms properly.  Provided a callback number to schedule patient.  Basilio Cairo, RN

## 2022-05-01 ENCOUNTER — Encounter: Admit: 2022-05-01 | Discharge: 2022-05-01 | Payer: MEDICARE

## 2022-05-01 MED ORDER — SYMBICORT 160-4.5 MCG/ACTUATION IN HFAA
RESPIRATORY_TRACT | 0 refills | 30.00000 days | Status: AC
Start: 2022-05-01 — End: ?

## 2022-05-26 ENCOUNTER — Encounter: Admit: 2022-05-26 | Discharge: 2022-05-26 | Payer: MEDICARE

## 2022-06-01 ENCOUNTER — Encounter: Admit: 2022-06-01 | Discharge: 2022-06-01 | Payer: MEDICARE

## 2022-06-01 MED ORDER — ATORVASTATIN 40 MG PO TAB
ORAL_TABLET | 3 refills | Status: AC
Start: 2022-06-01 — End: ?

## 2022-06-10 ENCOUNTER — Encounter: Admit: 2022-06-10 | Discharge: 2022-06-10 | Payer: MEDICARE

## 2022-06-10 MED ORDER — LOSARTAN 25 MG PO TAB
25 mg | ORAL_TABLET | Freq: Every day | ORAL | 3 refills | 90.00000 days | Status: AC
Start: 2022-06-10 — End: ?

## 2022-06-10 NOTE — Telephone Encounter
Pt states taking a half pill daily for long duration states was changed during OV

## 2022-06-11 ENCOUNTER — Encounter: Admit: 2022-06-11 | Discharge: 2022-06-11 | Payer: MEDICARE

## 2022-06-11 ENCOUNTER — Ambulatory Visit: Admit: 2022-06-11 | Discharge: 2022-06-11 | Payer: MEDICARE

## 2022-06-11 DIAGNOSIS — M542 Cervicalgia: Secondary | ICD-10-CM

## 2022-06-11 DIAGNOSIS — Z7901 Long term (current) use of anticoagulants: Secondary | ICD-10-CM

## 2022-06-11 DIAGNOSIS — R198 Other specified symptoms and signs involving the digestive system and abdomen: Secondary | ICD-10-CM

## 2022-06-11 DIAGNOSIS — A0472 Enterocolitis due to Clostridium difficile, not specified as recurrent: Secondary | ICD-10-CM

## 2022-06-11 DIAGNOSIS — I4891 Unspecified atrial fibrillation: Secondary | ICD-10-CM

## 2022-06-11 DIAGNOSIS — R002 Palpitations: Secondary | ICD-10-CM

## 2022-06-11 DIAGNOSIS — R9439 Abnormal result of other cardiovascular function study: Secondary | ICD-10-CM

## 2022-06-11 DIAGNOSIS — I10 Essential (primary) hypertension: Secondary | ICD-10-CM

## 2022-06-11 DIAGNOSIS — I2510 Atherosclerotic heart disease of native coronary artery without angina pectoris: Secondary | ICD-10-CM

## 2022-06-11 DIAGNOSIS — J449 Chronic obstructive pulmonary disease, unspecified: Secondary | ICD-10-CM

## 2022-06-11 DIAGNOSIS — Z86718 Personal history of other venous thrombosis and embolism: Secondary | ICD-10-CM

## 2022-06-11 DIAGNOSIS — I5022 Chronic systolic (congestive) heart failure: Secondary | ICD-10-CM

## 2022-06-11 DIAGNOSIS — E785 Hyperlipidemia, unspecified: Secondary | ICD-10-CM

## 2022-06-11 DIAGNOSIS — M549 Dorsalgia, unspecified: Secondary | ICD-10-CM

## 2022-06-11 DIAGNOSIS — Z125 Encounter for screening for malignant neoplasm of prostate: Secondary | ICD-10-CM

## 2022-06-11 DIAGNOSIS — F419 Anxiety disorder, unspecified: Secondary | ICD-10-CM

## 2022-06-11 DIAGNOSIS — R238 Other skin changes: Secondary | ICD-10-CM

## 2022-06-11 DIAGNOSIS — I82409 Acute embolism and thrombosis of unspecified deep veins of unspecified lower extremity: Secondary | ICD-10-CM

## 2022-06-11 DIAGNOSIS — I426 Alcoholic cardiomyopathy: Secondary | ICD-10-CM

## 2022-06-11 DIAGNOSIS — Z23 Encounter for immunization: Secondary | ICD-10-CM

## 2022-06-11 DIAGNOSIS — Z8679 Personal history of other diseases of the circulatory system: Secondary | ICD-10-CM

## 2022-06-11 LAB — COMPREHENSIVE METABOLIC PANEL
POTASSIUM: 4.9 MMOL/L (ref <100)
SODIUM: 141 MMOL/L (ref >40)

## 2022-06-11 LAB — CBC
HEMATOCRIT: 45 % — ABNORMAL HIGH (ref 40–50)
MCH: 30 pg (ref 26–34)
MCV: 91 FL (ref 80–100)
RBC COUNT: 4.9 M/UL (ref 4.4–5.5)
WBC COUNT: 10 K/UL (ref 4.5–11.0)

## 2022-06-11 LAB — LIPID PROFILE
CHOLESTEROL: 125 mg/dL (ref <200)
TRIGLYCERIDES: 94 mg/dL (ref <150)

## 2022-06-11 LAB — TSH WITH FREE T4 REFLEX: TSH: 2.1 uU/mL (ref 0.35–5.00)

## 2022-06-11 LAB — PSA SCREEN: PSA SCREEN: 0.3 ng/mL (ref <4.01)

## 2022-06-11 LAB — MAGNESIUM: MAGNESIUM: 1.7 mg/dL (ref 1.6–2.6)

## 2022-06-11 NOTE — Patient Instructions
Neck pain- stretching-ice- voltaren ( diclofenac gel)

## 2022-06-11 NOTE — Progress Notes
Date of Service: 06/11/2022    Jonathan Barr is a 66 y.o. male. DOB: Oct 10, 1955   MRN#: [id number]    Subjective:       66 yo male c/o neck pain   Recently had a lot of neck stiffness- started 3-4 days ago-had some tingling in his lt 5th finger -the tingling lasted about 4 hrs  Some dizziness  Has nocturia   Would like some labs drawn  Hx of afib- htn- hyperlipidemia- would like a flu shot- reviewed hx in epic briefly           Review of Systems   Cardiovascular: Positive for palpitations.   Psychiatric/Behavioral:        Can feel anxious         Objective:     ? acetaminophen SR(+) (TYLENOL) 650 mg tablet Take one tablet by mouth every 6 hours as needed for Pain.   ? albuterol (VENTOLIN HFA, PROAIR HFA, PROVENTIL HFA) 90 mcg/actuation inhaler Inhale two puffs by mouth into the lungs every 6 hours as needed for Wheezing or Shortness of Breath. Shake well before use.   ? ALPRAZolam (XANAX) 0.5 mg tablet Take one tablet by mouth three times daily as needed.   ? atorvastatin (LIPITOR) 40 mg tablet Take 1 tablet by mouth once daily   ? dofetilide (TIKOSYN) 500 mcg capsule Take 1 capsule by mouth twice daily   ? L.ACID/L.CASEI/B.BIF/B.LON/FOS (PROBIOTIC BLEND PO) Take 1 tablet by mouth daily.   ? losartan (COZAAR) 25 mg tablet Take one tablet by mouth daily.   ? Magnesium Oxide 500 mg tab Take one tablet by mouth twice daily.   ? metoprolol succinate XL (TOPROL XL) 100 mg extended release tablet Take one tablet by mouth daily.   ? omeprazole DR (PRILOSEC) 20 mg capsule Take one capsule by mouth daily.   ? spironolactone (ALDACTONE) 25 mg tablet TAKE 1/2 (ONE-HALF) TABLET BY MOUTH ONCE DAILY WITH FOOD   ? SYMBICORT 160-4.5 mcg/actuation aerosol inhaler Inhale 2 puffs by mouth twice daily   ? XARELTO 20 mg tablet TAKE 1 TABLET BY MOUTH ONCE DAILY WITH SUPPER     Vitals:    06/11/22 1215   BP: (!) 134/90   BP Source: Arm, Left Upper   Pulse: 64   Temp: 36.3 ?C (97.4 ?F)   SpO2: 97%   TempSrc: Temporal   PainSc: Four   Weight: 88.9 kg (196 lb)     Body mass index is 25.86 kg/m?Marland Kitchen     Physical Exam  Vitals and nursing note reviewed.   Constitutional:       Appearance: Normal appearance. He is well-developed.   HENT:      Head: Normocephalic and atraumatic.      Right Ear: Tympanic membrane and ear canal normal.      Left Ear: Tympanic membrane and ear canal normal.      Mouth/Throat:      Mouth: Mucous membranes are moist.      Pharynx: Oropharynx is clear.   Eyes:      Conjunctiva/sclera: Conjunctivae normal.      Pupils: Pupils are equal, round, and reactive to light.   Neck:      Trachea: No tracheal deviation.      Comments: His neck is somewhat stiff  Cardiovascular:      Rate and Rhythm: Normal rate and regular rhythm.      Heart sounds: No murmur heard.  Pulmonary:      Effort: Pulmonary effort  is normal.      Breath sounds: Normal breath sounds.   Abdominal:      General: Bowel sounds are normal. There is no distension.      Palpations: Abdomen is soft. There is no mass.      Tenderness: There is no abdominal tenderness.   Musculoskeletal:      Cervical back: Neck supple.      Right lower leg: No edema.      Left lower leg: No edema.   Lymphadenopathy:      Cervical: No cervical adenopathy.   Skin:     General: Skin is warm and dry.   Neurological:      General: No focal deficit present.      Mental Status: He is alert and oriented to person, place, and time.      Cranial Nerves: No cranial nerve deficit.   Psychiatric:         Mood and Affect: Mood normal.         Behavior: Behavior normal.         Thought Content: Thought content normal.         Judgment: Judgment normal.       A/p  Neck pain- ice- stretches- voltaren gel- c spine plain films  Flu shot -pt would like to defer his prevnar 20 until his next trip to clinic  Atrial fib- sees cardiology- on tikosyn - xarelto - and toprol xl  Screening psa  htn- need to have pt recheck his bp at home- a little high today in clinic  Hyperlipidemia- check lab  rtc in 6 months for a physical and prn

## 2022-07-24 IMAGING — CT ABDOMEN_PELVIS W(Adult)
2 of 4 series · 15 of 46 positions shown, 17 images · non-contrast
Comparison: none

[Series 2: abdomen_pelvis ax 3.00 br40 s3 · axial · 0.67mm/px · z∈[+1457,+1838]mm · 12 of 115 slices shown, 14 images]
[im 9/115  soft-tissue]
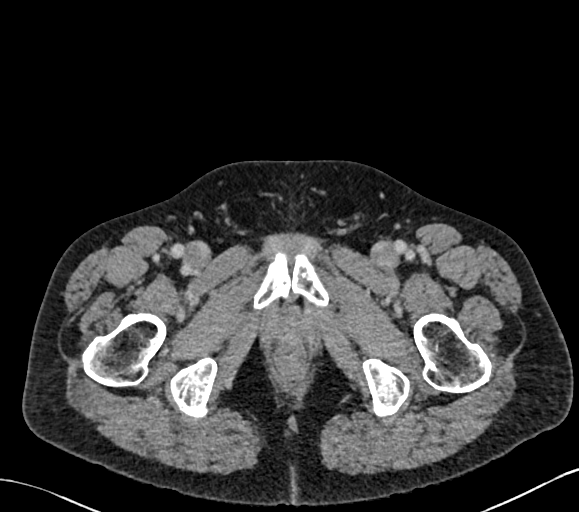
[im 9/115  bone]
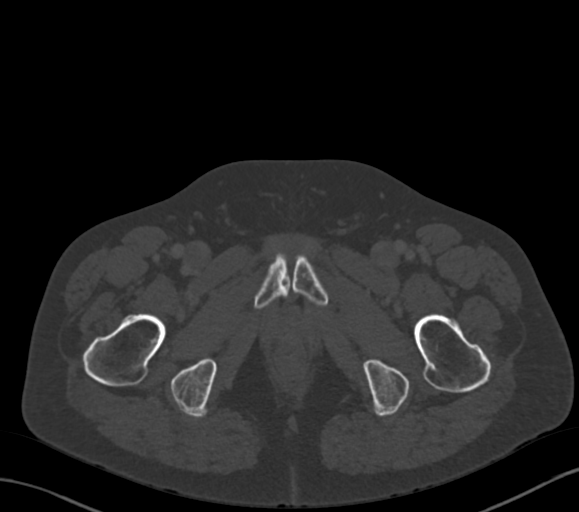
[im 17/115  soft-tissue]
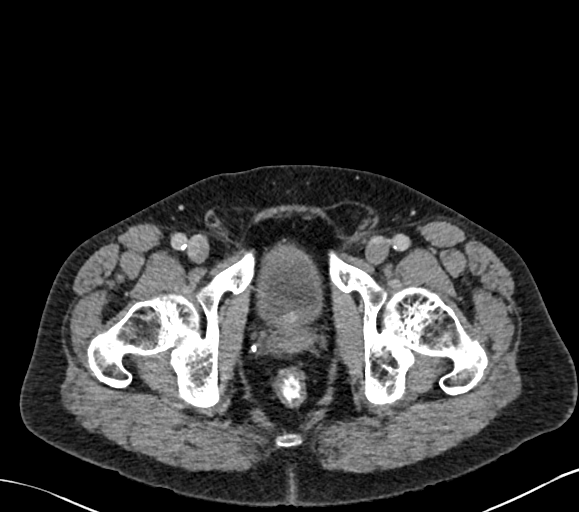
[im 25/115  soft-tissue]
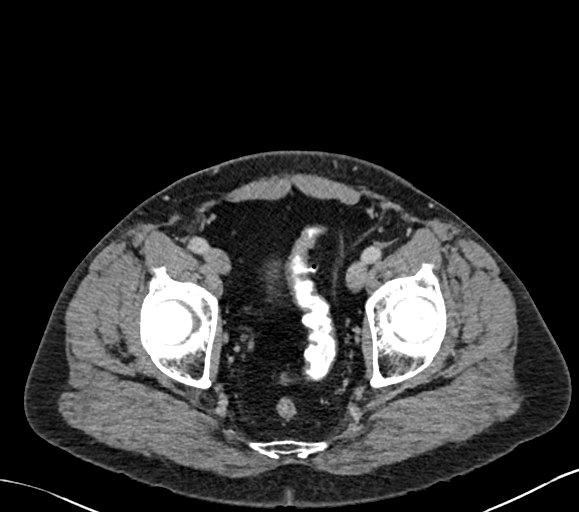
[im 33/115  soft-tissue]
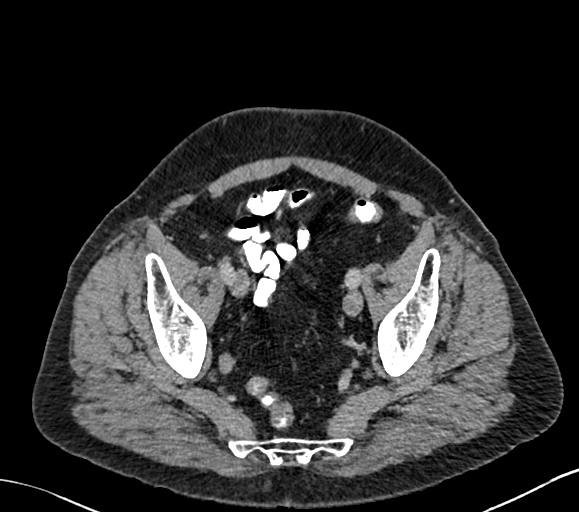
[im 45/115  soft-tissue]
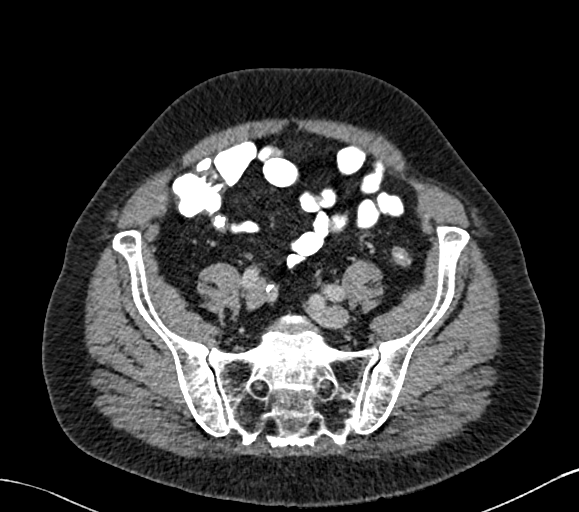
[im 53/115  soft-tissue]
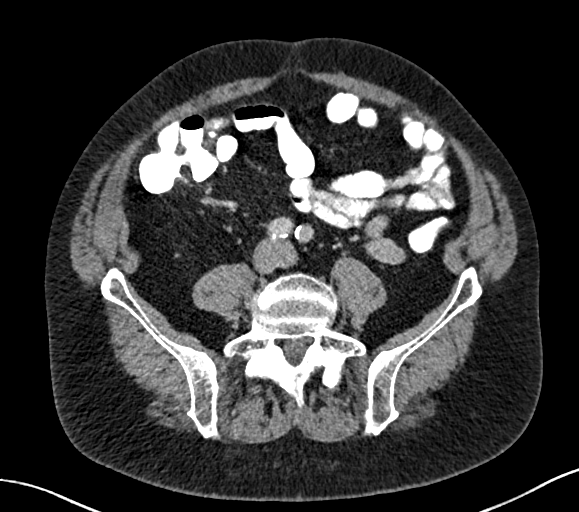
[im 62/115  soft-tissue]
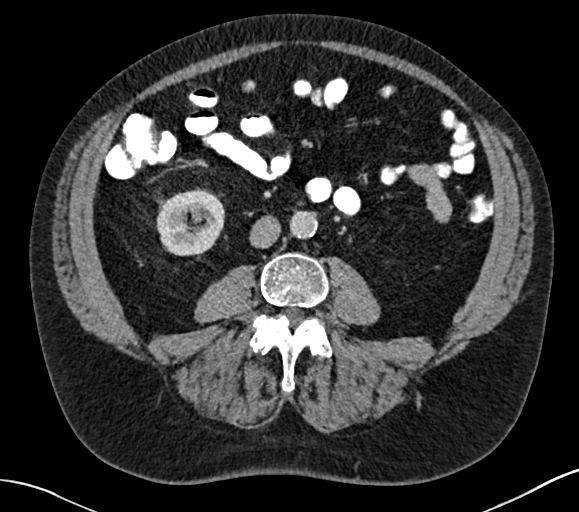
[im 70/115  soft-tissue]
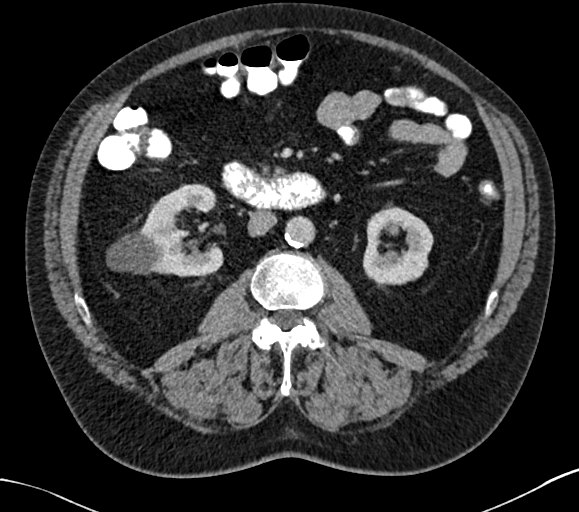
[im 82/115  soft-tissue]
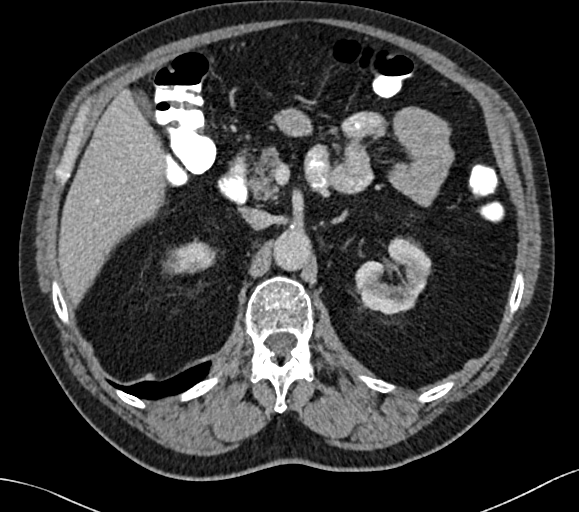
[im 82/115  bone]
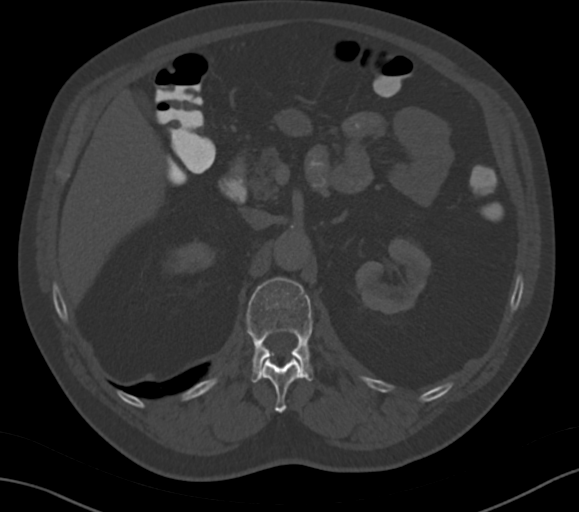
[im 90/115  soft-tissue]
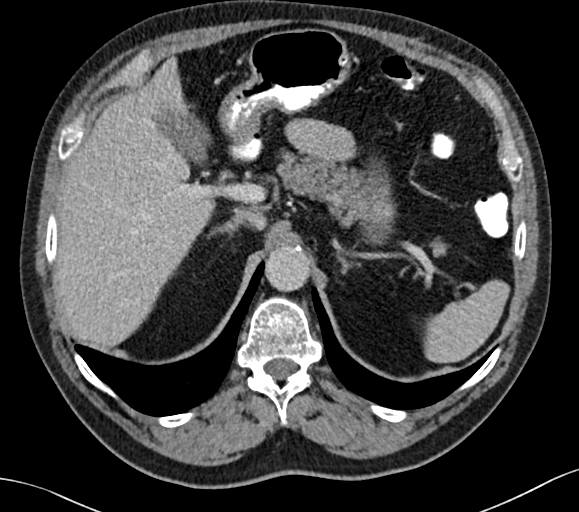
[im 98/115  soft-tissue]
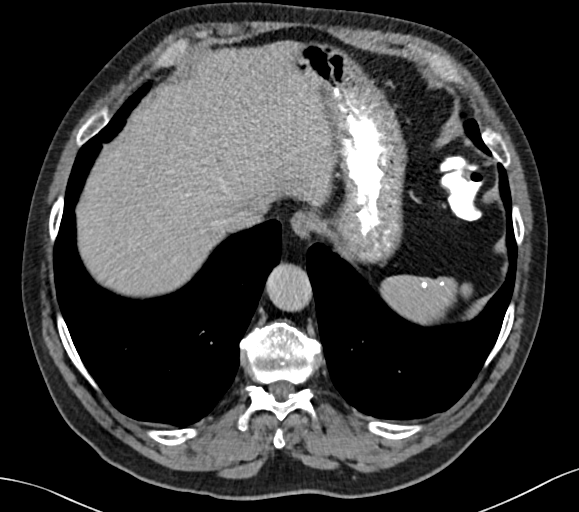
[im 106/115  soft-tissue]
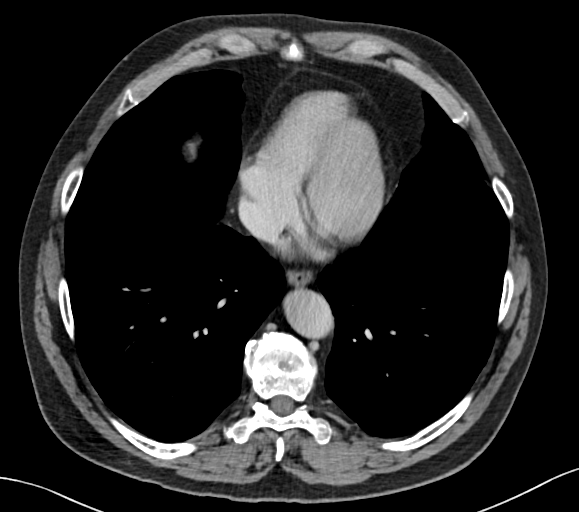

[Series 4: abdomen_pelvis cor 3.00 br40 s3 · coronal · 0.76mm/px · 3 of 83 slices shown]
[im 28/83  soft-tissue]
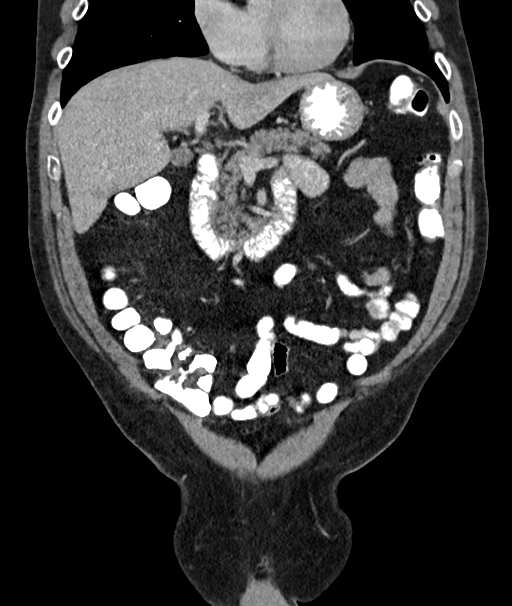
[im 37/83  soft-tissue]
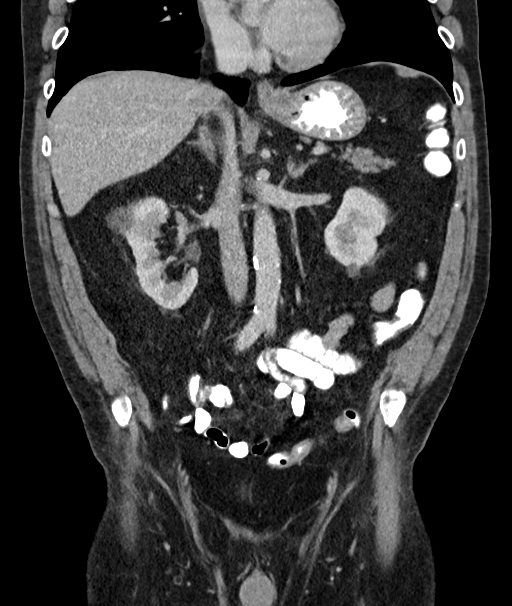
[im 46/83  soft-tissue]
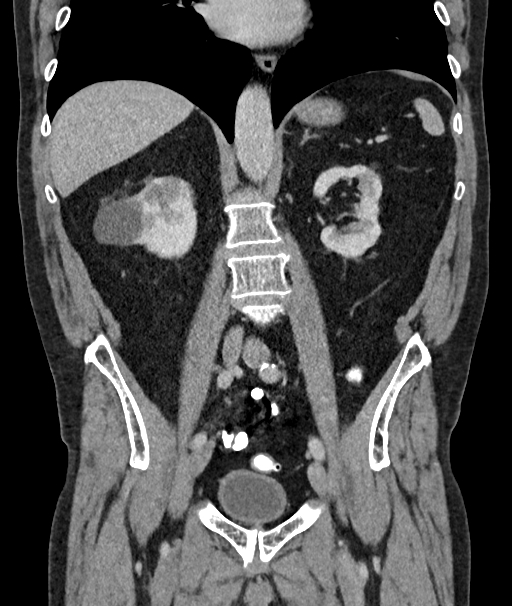

[15 of 46 positions shown; findings below may reference images not displayed]

DIAGNOSTIC STUDIES

EXAM

CT of the abdomen and pelvis with IV and oral contrast

INDICATION

acute abd pain , diarrhea
PT STATES HAS ABD PAIN, BLOATING. HX OF VENTRAL HERNIA REPAIR. GFR 37. 50ML [id number]. CT/NM 0/0. TJ

TECHNIQUE

All CT scans at this facility use dose modulation, iterative reconstruction, and/or weight based
dosing when appropriate to reduce radiation dose to as low as reasonably achievable.

Number of previous computed tomography exams in the last 12 months is 0  .

Number of previous nuclear medicine myocardial perfusion studies in the last 12 months is 0  .

COMPARISONS

April 09, 2016

FINDINGS

Lung bases: 4 millimeter nodule in the left lower lobe.

Liver: Normal appearing.

Gallbladder: Normal appearing.

Bile ducts: No intra or extrahepatic biliary ductal dilation.

Spleen: Calcified granulomata.

Pancreas: Normal for age.

Adrenals: Normal appearing.

Kidneys: 4 cm right renal midpole cyst. Smaller subcentimeter hypodensities. Higher density 8
millimeter lesion in the left inferior lower pole on series 4, image 60 is stable from 2356,
consistent with benign etiology such as hemorrhagic or proteinaceous cyst. No hydronephrosis.

Vasculature: Abdominal aorta is normal in caliber with mild atheromatous plaque.

Bowel: No dilated loops of bowel. No obstruction. Oral contrast has reached the rectum. Appendix is
normal and fills with contrast.

Free fluid: None.

Lymph nodes: No enlarged nodes in the abdomen or pelvis by size criteria.

Bladder: Unremarkable.

Osseous structures: Degenerative changes of the spine. Osseous demineralization.

Other: Small inguinal fat containing hernias.

IMPRESSION

No acute findings in the abdomen or pelvis.

Left lower lobe 4 millimeter nodule. In a high-risk patient an optional 12 month follow-up CT can be
considered.

Tech Notes:

PT STATES HAS ABD PAIN, BLOATING. HX OF VENTRAL HERNIA REPAIR. GFR 37. 50ML [id number]. CT/NM 0/0. TJ

## 2022-08-27 ENCOUNTER — Encounter: Admit: 2022-08-27 | Discharge: 2022-08-27 | Payer: MEDICARE

## 2022-08-27 DIAGNOSIS — I5022 Chronic systolic (congestive) heart failure: Secondary | ICD-10-CM

## 2022-08-27 DIAGNOSIS — R238 Other skin changes: Secondary | ICD-10-CM

## 2022-08-27 DIAGNOSIS — J449 Chronic obstructive pulmonary disease, unspecified: Secondary | ICD-10-CM

## 2022-08-27 DIAGNOSIS — Z7901 Long term (current) use of anticoagulants: Secondary | ICD-10-CM

## 2022-08-27 DIAGNOSIS — I10 Essential (primary) hypertension: Secondary | ICD-10-CM

## 2022-08-27 DIAGNOSIS — E785 Hyperlipidemia, unspecified: Secondary | ICD-10-CM

## 2022-08-27 DIAGNOSIS — I2510 Atherosclerotic heart disease of native coronary artery without angina pectoris: Secondary | ICD-10-CM

## 2022-08-27 DIAGNOSIS — M549 Dorsalgia, unspecified: Secondary | ICD-10-CM

## 2022-08-27 DIAGNOSIS — I426 Alcoholic cardiomyopathy: Secondary | ICD-10-CM

## 2022-08-27 DIAGNOSIS — I4891 Unspecified atrial fibrillation: Secondary | ICD-10-CM

## 2022-08-27 DIAGNOSIS — F419 Anxiety disorder, unspecified: Secondary | ICD-10-CM

## 2022-08-27 DIAGNOSIS — R9439 Abnormal result of other cardiovascular function study: Secondary | ICD-10-CM

## 2022-08-27 DIAGNOSIS — E7800 Pure hypercholesterolemia, unspecified: Secondary | ICD-10-CM

## 2022-08-27 DIAGNOSIS — Z86718 Personal history of other venous thrombosis and embolism: Secondary | ICD-10-CM

## 2022-08-27 DIAGNOSIS — R002 Palpitations: Secondary | ICD-10-CM

## 2022-08-27 DIAGNOSIS — A0472 Enterocolitis due to Clostridium difficile, not specified as recurrent: Secondary | ICD-10-CM

## 2022-08-27 DIAGNOSIS — I480 Paroxysmal atrial fibrillation: Secondary | ICD-10-CM

## 2022-08-27 DIAGNOSIS — R198 Other specified symptoms and signs involving the digestive system and abdomen: Secondary | ICD-10-CM

## 2022-08-27 DIAGNOSIS — Z8679 Personal history of other diseases of the circulatory system: Secondary | ICD-10-CM

## 2022-08-27 DIAGNOSIS — R0989 Other specified symptoms and signs involving the circulatory and respiratory systems: Secondary | ICD-10-CM

## 2022-08-27 DIAGNOSIS — I82409 Acute embolism and thrombosis of unspecified deep veins of unspecified lower extremity: Secondary | ICD-10-CM

## 2022-08-27 NOTE — Assessment & Plan Note
Lab Results   Component Value Date    CHOL 125 06/11/2022    TRIG 94 06/11/2022    HDL 41 06/11/2022    LDL 80 06/11/2022    VLDL 19 06/11/2022    NONHDLCHOL 84 06/11/2022    CHOLHDLC 4.0 11/19/2017      LDL looks OK--no significant atherosclerotic disease.

## 2022-08-27 NOTE — Assessment & Plan Note
BP looks fine on the current medical program.

## 2022-08-27 NOTE — Assessment & Plan Note
He has been pretty symptomatic when in atrial fibrillation in the past.  He has not had symptoms to suggest any recurrent arrhythmia.  He has now been on Tikosyn for a little over 5 years and it seems to have done a good job in suppressing his arrhythmia.  The QTc on today's EKG looked fine.

## 2022-08-27 NOTE — Assessment & Plan Note
He has not had any bleeding complications nor has he had any embolic symptoms.

## 2022-08-27 NOTE — Progress Notes
Date of Service: 08/27/2022    Jonathan Barr is a 67 y.o. male.       HPI     Jonathan Barr was in the [facility] today for follow-up regarding paroxysmal atrial fibrillation.  He seems to be having a good winter and is kind of laying low during the cold weather.     He denies any problems with chest discomfort or breathlessness.  He has had no palpitations to suggest recurrent atrial fibrillation.     He knows that alcohol has been a problem for him in the past.  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  He continues to have occasional cocktails, but says that he's very moderate and never had more than 2 drinks.  He denies any TIA or stroke symptoms.         Vitals:    08/27/22 0942   BP: 118/76   BP Source: Arm, Left Upper   Pulse: 66   SpO2: 99%   O2 Device: None (Room air)   PainSc: Three   Weight: 94.1 kg (207 lb 6.4 oz)   Height: 185.4 cm (6' 1)     Body mass index is 27.36 kg/m?Jonathan Barr     Past Medical History  Patient Active Problem List    Diagnosis Date Noted    Cervical arthritis 08/26/2021    Cellulitis of left leg 07/12/2018    AKI (acute kidney injury) (HCC) 07/12/2018    History of DVT (deep vein thrombosis) 07/12/2018    Chronic anticoagulation, on rivaroxiban 07/12/2018    Sepsis (HCC) 07/11/2018    Traumatic hematoma of knee 05/31/2018    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  Rare non-sustained SVT.  Rare APD's    12/20/15 TEE with evidence of LA thrombus. Canceled DCCV. Digoxin loaded.  12/2015 - Successful TEE-cardioversion.  Started on Tikosyn.      COPD (chronic obstructive pulmonary disease) (HCC) 12/18/2015    Nocturnal oxygen desaturation - O2 use 12/18/2015    Colon polyp 02/07/2013     12/2012 - Colonoscopy      GERD (gastroesophageal reflux disease) 02/07/2013     12/2012- EGD negative for Barrett's      Pulmonary nodule 02/07/2013     12/2012 - CT-chest 4.7 mm nodular opacity of peripheral left lower lobe.  F/U CT recommended in 6 months.      Essential hypertension 04/24/2011    History of tobacco use, quit in 2012 04/24/2011    History of alcoholic cardiomyopathy      EF  48%  per stress echo 02/29/00.    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  Normal study.      Anxiety     Chest pain 09/24/2006        a. Stress Echo 02/29/00 with mild resting global LV asyneresis. Induced inf and post wall myocardial ischemia      b. 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      c.  Cardiac cath 2/08 - Normal coronaries, normal LV function        Hyperlipidemia  Review of Systems   Constitutional: Negative.   HENT:  Positive for tinnitus.    Eyes: Negative.    Cardiovascular:  Positive for chest pain, dyspnea on exertion, irregular heartbeat and leg swelling.   Respiratory:  Positive for shortness of breath.    Endocrine: Negative.    Hematologic/Lymphatic: Negative.    Skin: Negative.    Musculoskeletal:  Positive for arthritis, back pain, joint pain, myalgias, neck pain and stiffness.   Gastrointestinal:  Positive for diarrhea.   Genitourinary: Negative.    Neurological:  Positive for headaches.   Psychiatric/Behavioral: Negative.     Allergic/Immunologic: Negative.        Physical Exam    Physical Exam   General Appearance: no distress   Skin: warm, no ulcers or xanthomas   Digits and Nails: no cyanosis or clubbing   Eyes: conjunctivae and lids normal, pupils are equal and round   Teeth/Gums/Palate: dentition unremarkable, no lesions   Lips & Oral Mucosa: no pallor or cyanosis   Neck Veins: normal JVP , neck veins are not distended   Thyroid: no nodules, masses, tenderness or enlargement   Chest Inspection: chest is normal in appearance   Respiratory Effort: breathing comfortably, no respiratory distress   Auscultation/Percussion: lungs clear to auscultation, no rales or rhonchi, no wheezing   PMI: PMI not enlarged or displaced   Cardiac Rhythm: regular rhythm and normal rate   Cardiac Auscultation: S1, S2 normal, no rub, no gallop   Murmurs: no murmur   Peripheral Circulation: normal peripheral circulation   Carotid Arteries: normal carotid upstroke bilaterally, no bruits   Radial Arteries: normal symmetric radial pulses   Abdominal Aorta: no abdominal aortic bruit   Pedal Pulses: normal symmetric pedal pulses   Lower Extremity Edema: no lower extremity edema   Abdominal Exam: soft, non-tender, no masses, bowel sounds normal   Liver & Spleen: no organomegaly   Gait & Station: walks without assistance   Muscle Strength: normal muscle tone   Orientation: oriented to time, place and person   Affect & Mood: appropriate and sustained affect   Language and Memory: patient responsive and seems to comprehend information   Neurologic Exam: neurological assessment grossly intact   Other: moves all extremities      Cardiovascular Studies    EKG:  SR, rate 60.  QTc 414 msec.    Cardiovascular Health Factors  Vitals BP Readings from Last 3 Encounters:   08/27/22 118/76   06/11/22 (!) 134/90   02/24/22 132/80     Wt Readings from Last 3 Encounters:   08/27/22 94.1 kg (207 lb 6.4 oz)   06/11/22 88.9 kg (196 lb)   02/24/22 91.4 kg (201 lb 9.6 oz)     BMI Readings from Last 3 Encounters:   08/27/22 27.36 kg/m?   06/11/22 25.86 kg/m?   02/24/22 26.60 kg/m?      Smoking Social History     Tobacco Use   Smoking Status Former    Packs/day: 0.50    Years: 30.00    Additional pack years: 0.00    Total pack years: 15.00    Types: Cigarettes    Quit date: 12/06/2010    Years since quitting: 11.7   Smokeless Tobacco Never   Tobacco Comments    cessation counseling given 07/15/10      Lipid Profile Cholesterol   Date Value Ref Range Status   06/11/2022 125 <200 MG/DL Final     HDL   Date Value Ref Range  Status   06/11/2022 41 >40 MG/DL Final     LDL   Date Value Ref Range Status   06/11/2022 80 <100 mg/dL Final     Triglycerides   Date Value Ref Range Status   06/11/2022 94 <150 MG/DL Final      Blood Sugar Hemoglobin A1C   Date Value Ref Range Status   07/24/2016 6.0 4.0 - 6.0 % Final     Comment:     The ADA recommends that most patients with type 1 and type 2 diabetes maintain   an A1c level <7%.       Glucose   Date Value Ref Range Status   06/11/2022 91 70 - 100 MG/DL Final   45/40/9811 914  Final   06/02/2021 96  Final     Glucose, POC   Date Value Ref Range Status   07/11/2018 77 70 - 100 MG/DL Final   78/29/5621 65 (L) 70 - 100 MG/DL Final          Problems Addressed Today  Encounter Diagnoses   Name Primary?    Cardiovascular symptoms Yes    Atrial fibrillation, unspecified type (HCC)     Essential hypertension     Pure hypercholesterolemia     History of alcoholic cardiomyopathy     Chronic anticoagulation, on rivaroxiban     Paroxysmal atrial fibrillation (HCC)        Assessment and Plan       Essential hypertension  BP looks fine on the current medical program.    Hyperlipidemia  Lab Results   Component Value Date    CHOL 125 06/11/2022    TRIG 94 06/11/2022    HDL 41 06/11/2022    LDL 80 06/11/2022    VLDL 19 06/11/2022    NONHDLCHOL 84 06/11/2022    CHOLHDLC 4.0 11/19/2017      LDL looks OK--no significant atherosclerotic disease.    History of alcoholic cardiomyopathy  We talked a little bit today that he needs to be really careful about his alcohol consumption.  I worry some that he can slide back into the situation he was in several years ago.    Chronic anticoagulation, on rivaroxiban  He has not had any bleeding complications nor has he had any embolic symptoms.    Paroxysmal atrial fibrillation (HCC)  He has been pretty symptomatic when in atrial fibrillation in the past.  He has not had symptoms to suggest any recurrent arrhythmia.  He has now been on Tikosyn for a little over 5 years and it seems to have done a good job in suppressing his arrhythmia.  The QTc on today's EKG looked fine.      Current Medications (including today's revisions)   acetaminophen SR(+) (TYLENOL) 650 mg tablet Take one tablet by mouth every 6 hours as needed for Pain.    albuterol (VENTOLIN HFA, PROAIR HFA, PROVENTIL HFA) 90 mcg/actuation inhaler Inhale two puffs by mouth into the lungs every 6 hours as needed for Wheezing or Shortness of Breath. Shake well before use.    ALPRAZolam (XANAX) 0.5 mg tablet Take one tablet by mouth three times daily as needed.    atorvastatin (LIPITOR) 40 mg tablet Take 1 tablet by mouth once daily    dofetilide (TIKOSYN) 500 mcg capsule Take 1 capsule by mouth twice daily    L.ACID/L.CASEI/B.BIF/B.LON/FOS (PROBIOTIC BLEND PO) Take 1 tablet by mouth every 7 days.    losartan (COZAAR) 25 mg tablet Take one tablet by mouth  daily.    Magnesium Oxide 500 mg tab Take one tablet by mouth twice daily.    metoprolol succinate XL (TOPROL XL) 100 mg extended release tablet Take one tablet by mouth daily.    omeprazole DR (PRILOSEC) 20 mg capsule Take one capsule by mouth daily.    spironolactone (ALDACTONE) 25 mg tablet TAKE 1/2 (ONE-HALF) TABLET BY MOUTH ONCE DAILY WITH FOOD    SYMBICORT 160-4.5 mcg/actuation aerosol inhaler Inhale 2 puffs by mouth twice daily    XARELTO 20 mg tablet TAKE 1 TABLET BY MOUTH ONCE DAILY WITH SUPPER     Total time spent on today's office visit was 30 minutes.  This includes face-to-face in person visit with patient as well as nonface-to-face time including review of the EMR, outside records, labs, radiologic studies, echocardiogram & other cardiovascular studies, formation of treatment plan, after visit summary, future disposition, and lastly on documentation.

## 2022-08-27 NOTE — Assessment & Plan Note
We talked a little bit today that he needs to be really careful about his alcohol consumption.  I worry some that he can slide back into the situation he was in several years ago.

## 2022-09-02 ENCOUNTER — Encounter: Admit: 2022-09-02 | Discharge: 2022-09-02 | Payer: MEDICARE

## 2022-09-02 MED ORDER — XARELTO 20 MG PO TAB
ORAL_TABLET | ORAL | 0 refills | 30.00000 days | Status: AC
Start: 2022-09-02 — End: ?

## 2022-09-22 ENCOUNTER — Encounter: Admit: 2022-09-22 | Discharge: 2022-09-22 | Payer: MEDICARE

## 2022-09-22 DIAGNOSIS — I10 Essential (primary) hypertension: Secondary | ICD-10-CM

## 2022-09-22 DIAGNOSIS — I5022 Chronic systolic (congestive) heart failure: Secondary | ICD-10-CM

## 2022-09-22 DIAGNOSIS — J449 Chronic obstructive pulmonary disease, unspecified: Secondary | ICD-10-CM

## 2022-09-22 DIAGNOSIS — I4819 Other persistent atrial fibrillation: Secondary | ICD-10-CM

## 2022-09-22 DIAGNOSIS — G4734 Idiopathic sleep related nonobstructive alveolar hypoventilation: Secondary | ICD-10-CM

## 2022-09-22 DIAGNOSIS — I513 Intracardiac thrombosis, not elsewhere classified: Secondary | ICD-10-CM

## 2022-09-22 DIAGNOSIS — E7800 Pure hypercholesterolemia, unspecified: Secondary | ICD-10-CM

## 2022-09-22 DIAGNOSIS — K219 Gastro-esophageal reflux disease without esophagitis: Secondary | ICD-10-CM

## 2022-09-22 MED ORDER — DOFETILIDE 500 MCG PO CAP
500 ug | ORAL_CAPSULE | Freq: Two times a day (BID) | ORAL | 3 refills | Status: AC
Start: 2022-09-22 — End: ?

## 2022-09-29 ENCOUNTER — Encounter: Admit: 2022-09-29 | Discharge: 2022-09-29 | Payer: MEDICARE

## 2022-09-29 MED ORDER — XARELTO 20 MG PO TAB
ORAL_TABLET | ORAL | 11 refills | 30.00000 days | Status: AC
Start: 2022-09-29 — End: ?

## 2022-10-24 ENCOUNTER — Encounter: Admit: 2022-10-24 | Discharge: 2022-10-24 | Payer: MEDICARE

## 2022-10-24 DIAGNOSIS — F419 Anxiety disorder, unspecified: Secondary | ICD-10-CM

## 2022-10-24 DIAGNOSIS — R079 Chest pain, unspecified: Secondary | ICD-10-CM

## 2022-10-24 MED ORDER — SPIRONOLACTONE 25 MG PO TAB
ORAL_TABLET | 0 refills
Start: 2022-10-24 — End: ?

## 2022-10-24 MED ORDER — ALPRAZOLAM 0.5 MG PO TAB
0.5 mg | ORAL_TABLET | Freq: Three times a day (TID) | ORAL | 0 refills | PRN
Start: 2022-10-24 — End: ?

## 2022-10-31 ENCOUNTER — Encounter: Admit: 2022-10-31 | Discharge: 2022-10-31 | Payer: MEDICARE

## 2022-10-31 NOTE — Telephone Encounter
10/31/2022 10:19 AM WEEKEND OC NOTE:Pt called asking for Rx for Alprazolam.     Received electronic refill request on 3/12 for this and Sprio, the spiro went through electroniclly, the alprazolam says "print"   I called [facility], they allowed me to give a verbal auth from [facility]. This has been filled many times by [facility] for this pt.   [facility] will fill.

## 2023-01-19 ENCOUNTER — Encounter: Admit: 2023-01-19 | Discharge: 2023-01-19 | Payer: MEDICARE

## 2023-01-19 MED ORDER — SPIRONOLACTONE 25 MG PO TAB
ORAL_TABLET | ORAL | 3 refills | 90.00000 days | Status: AC
Start: 2023-01-19 — End: ?

## 2023-01-25 ENCOUNTER — Encounter: Admit: 2023-01-25 | Discharge: 2023-01-25 | Payer: MEDICARE

## 2023-02-01 ENCOUNTER — Ambulatory Visit: Admit: 2023-02-01 | Discharge: 2023-02-01 | Payer: MEDICARE

## 2023-02-01 ENCOUNTER — Encounter: Admit: 2023-02-01 | Discharge: 2023-02-01 | Payer: MEDICARE

## 2023-02-01 ENCOUNTER — Ambulatory Visit: Admit: 2023-02-01 | Discharge: 2023-02-02 | Payer: MEDICARE

## 2023-02-01 DIAGNOSIS — I2510 Atherosclerotic heart disease of native coronary artery without angina pectoris: Secondary | ICD-10-CM

## 2023-02-01 DIAGNOSIS — Z8679 Personal history of other diseases of the circulatory system: Secondary | ICD-10-CM

## 2023-02-01 DIAGNOSIS — K635 Polyp of colon: Secondary | ICD-10-CM

## 2023-02-01 DIAGNOSIS — Z86718 Personal history of other venous thrombosis and embolism: Secondary | ICD-10-CM

## 2023-02-01 DIAGNOSIS — E785 Hyperlipidemia, unspecified: Secondary | ICD-10-CM

## 2023-02-01 DIAGNOSIS — Z0000 Encounter for general adult medical examination without abnormal findings: Secondary | ICD-10-CM

## 2023-02-01 DIAGNOSIS — I426 Alcoholic cardiomyopathy: Secondary | ICD-10-CM

## 2023-02-01 DIAGNOSIS — R351 Nocturia: Secondary | ICD-10-CM

## 2023-02-01 DIAGNOSIS — I5022 Chronic systolic (congestive) heart failure: Secondary | ICD-10-CM

## 2023-02-01 DIAGNOSIS — M549 Dorsalgia, unspecified: Secondary | ICD-10-CM

## 2023-02-01 DIAGNOSIS — A0472 Enterocolitis due to Clostridium difficile, not specified as recurrent: Secondary | ICD-10-CM

## 2023-02-01 DIAGNOSIS — I4891 Unspecified atrial fibrillation: Secondary | ICD-10-CM

## 2023-02-01 DIAGNOSIS — J449 Chronic obstructive pulmonary disease, unspecified: Secondary | ICD-10-CM

## 2023-02-01 DIAGNOSIS — R9439 Abnormal result of other cardiovascular function study: Secondary | ICD-10-CM

## 2023-02-01 DIAGNOSIS — R198 Other specified symptoms and signs involving the digestive system and abdomen: Secondary | ICD-10-CM

## 2023-02-01 DIAGNOSIS — I10 Essential (primary) hypertension: Secondary | ICD-10-CM

## 2023-02-01 DIAGNOSIS — F419 Anxiety disorder, unspecified: Secondary | ICD-10-CM

## 2023-02-01 DIAGNOSIS — I82409 Acute embolism and thrombosis of unspecified deep veins of unspecified lower extremity: Secondary | ICD-10-CM

## 2023-02-01 DIAGNOSIS — R238 Other skin changes: Secondary | ICD-10-CM

## 2023-02-01 DIAGNOSIS — R002 Palpitations: Secondary | ICD-10-CM

## 2023-02-01 DIAGNOSIS — Z7901 Long term (current) use of anticoagulants: Secondary | ICD-10-CM

## 2023-02-01 DIAGNOSIS — Z23 Encounter for immunization: Secondary | ICD-10-CM

## 2023-02-01 LAB — COMPREHENSIVE METABOLIC PANEL
ALBUMIN: 4.4 g/dL (ref 3.5–5.0)
ALK PHOSPHATASE: 82 U/L (ref 25–110)
ALT: 14 U/L (ref 7–56)
ANION GAP: 10 (ref 3–12)
AST: 16 U/L (ref 7–40)
CO2: 27 MMOL/L (ref 21–30)
EGFR: 48 mL/min — ABNORMAL LOW (ref >60)
POTASSIUM: 4.8 MMOL/L (ref 3.5–5.1)
SODIUM: 142 MMOL/L (ref 137–147)
TOTAL BILIRUBIN: 0.6 mg/dL (ref 0.2–1.3)

## 2023-02-01 LAB — CBC
RBC COUNT: 4.6 M/UL (ref 4.4–5.5)
WBC COUNT: 10 K/UL (ref 4.5–11.0)

## 2023-02-01 LAB — MAGNESIUM: MAGNESIUM: 1.9 mg/dL (ref >40)

## 2023-02-01 LAB — LIPID PROFILE
CHOLESTEROL: 126 mg/dL (ref <200)
LDL: 79 mg/dL — ABNORMAL HIGH (ref <100)
NON HDL CHOLESTEROL: 81 mg/dL (ref 6.0–8.0)
TRIGLYCERIDES: 86 mg/dL (ref <150)
VLDL: 17 mg/dL (ref 8.5–10.6)

## 2023-02-01 MED ORDER — TAMSULOSIN 0.4 MG PO CAP
.4 mg | ORAL_CAPSULE | Freq: Every day | ORAL | 11 refills | 90.00000 days | Status: AC
Start: 2023-02-01 — End: ?

## 2023-02-01 MED ORDER — SYMBICORT 160-4.5 MCG/ACTUATION IN HFAA
2 | Freq: Two times a day (BID) | RESPIRATORY_TRACT | 3 refills | 30.00000 days | Status: AC
Start: 2023-02-01 — End: ?

## 2023-02-01 MED ORDER — METOPROLOL SUCCINATE 100 MG PO TB24
100 mg | ORAL_TABLET | Freq: Every day | ORAL | 3 refills | 90.00000 days | Status: AC
Start: 2023-02-01 — End: ?

## 2023-02-01 MED ORDER — COMBIVENT RESPIMAT 20-100 MCG/ACTUATION IN MIST
1 | Freq: Four times a day (QID) | RESPIRATORY_TRACT | 3 refills | 30.00000 days | Status: AC
Start: 2023-02-01 — End: ?

## 2023-02-01 NOTE — Progress Notes
Date of Service: 02/01/2023    Jonathan Barr is a 67 y.o. male. DOB: 04-Jan-1956   MRN#: [id number]    Subjective:       67 yo male here for an annual wellness visit   Feels ok overall  Has neck pain and stiffness  Hx of hept c - colon polyps- afib-COPD- GERD  Reviewed hx in epic briefly         Review of Systems   Constitutional: Negative for fever.        At lowest wt since 2019- pt been more active   Has an occ sweat   HENT:  Positive for tinnitus.         No recent URIs  Thinks his hearing is ok- except for the tinnitus   Eyes:         Has glasses- went to the eye doc    Cardiovascular:         No significant exertional chest pain  Occ palpitations  Occ ankle swelling    Respiratory:  Positive for cough.         Hx of COPD  Does not hear wheezing  Occ sob with mowing    Hematologic/Lymphatic:        Is on xarelto    Skin: Negative.    Musculoskeletal:  Positive for back pain and neck pain.   Gastrointestinal:  Positive for diarrhea. Negative for hematochezia, nausea and vomiting.        Some epigastric pain   Stools can be dark   Genitourinary:  Negative for dysuria and hematuria.        Nocturia x 6  Hx of bilateral hydroceles- rt larger than left   Neurological:  Positive for headaches.        Has tension headaches   Psychiatric/Behavioral:          Not sleeping well- has nocturia   Stress level is ok   Allergic/Immunologic:        Can have allergy sxs         Objective:      acetaminophen SR(+) (TYLENOL) 650 mg tablet Take one tablet by mouth every 6 hours as needed for Pain.    albuterol (VENTOLIN HFA, PROAIR HFA, PROVENTIL HFA) 90 mcg/actuation inhaler Inhale two puffs by mouth into the lungs every 6 hours as needed for Wheezing or Shortness of Breath. Shake well before use.    ALPRAZolam (XANAX) 0.5 mg tablet Take 1 tablet by mouth three times daily as needed    atorvastatin (LIPITOR) 40 mg tablet Take 1 tablet by mouth once daily    dofetilide (TIKOSYN) 500 mcg capsule Take 1 capsule by mouth twice daily L.ACID/L.CASEI/B.BIF/B.LON/FOS (PROBIOTIC BLEND PO) Take 1 tablet by mouth every 7 days.    losartan (COZAAR) 25 mg tablet Take one tablet by mouth daily.    Magnesium Oxide 500 mg tab Take one tablet by mouth twice daily.    metoprolol succinate XL (TOPROL XL) 100 mg extended release tablet Take one tablet by mouth daily.    omeprazole DR (PRILOSEC) 20 mg capsule Take one capsule by mouth daily.    rivaroxaban (XARELTO) 20 mg tablet TAKE 1 TABLET BY MOUTH ONCE DAILY WITH SUPPER    spironolactone (ALDACTONE) 25 mg tablet TAKE 1/2 (ONE-HALF) TABLET BY MOUTH ONCE DAILY WITH FOOD    SYMBICORT 160-4.5 mcg/actuation aerosol inhaler Inhale 2 puffs by mouth twice daily     Vitals:    02/01/23 1309  BP: 116/78   BP Source: Arm, Left Upper   Pulse: 54   Temp: 36.8 ?C (98.3 ?F)   SpO2: 97%   TempSrc: Temporal   PainSc: Zero   Weight: 87.7 kg (193 lb 6.4 oz)   Height: 178 cm (5' 10.08)     Body mass index is 27.69 kg/m?Marland Kitchen     Physical Exam  Vitals and nursing note reviewed.   Constitutional:       General: He is not in acute distress.     Appearance: Normal appearance. He is well-developed. He is not ill-appearing.   HENT:      Head: Normocephalic and atraumatic.      Right Ear: Tympanic membrane and ear canal normal.      Left Ear: Tympanic membrane and ear canal normal.      Mouth/Throat:      Mouth: Mucous membranes are moist.      Pharynx: Oropharynx is clear.   Eyes:      Conjunctiva/sclera: Conjunctivae normal.      Pupils: Pupils are equal, round, and reactive to light.   Neck:      Vascular: No carotid bruit.      Trachea: No tracheal deviation.   Cardiovascular:      Rate and Rhythm: Normal rate and regular rhythm.      Heart sounds: No murmur heard.  Pulmonary:      Effort: Pulmonary effort is normal.      Breath sounds: Normal breath sounds.   Abdominal:      General: Bowel sounds are normal. There is no distension.      Palpations: Abdomen is soft. There is no mass.      Tenderness: There is no abdominal tenderness.   Musculoskeletal:      Cervical back: Neck supple.      Right lower leg: No edema.      Left lower leg: No edema.   Lymphadenopathy:      Cervical: No cervical adenopathy.   Skin:     General: Skin is warm and dry.   Neurological:      General: No focal deficit present.      Mental Status: He is alert and oriented to person, place, and time.      Cranial Nerves: No cranial nerve deficit.   Psychiatric:         Mood and Affect: Mood normal.         Behavior: Behavior normal.         Thought Content: Thought content normal.         Judgment: Judgment normal.           A/p  PE- appears well- not smoking cigarettes - bmi over goal at 28- had a psa less than 1 in October  Prevnar 20 today  Colon polyps- his colonoscopy is due- ordered  Afib - followed by cardiology- on xarelto- tikosyn and toprol xl - check mag level  COPD- rfed his symbicort and combivent  Hyperlipidemia- on lipitor- check lab  Htn- good control  Nocturia - will try flomax  Rtc in 1 yr and prn

## 2023-03-11 ENCOUNTER — Encounter: Admit: 2023-03-11 | Discharge: 2023-03-11 | Payer: MEDICARE

## 2023-03-11 DIAGNOSIS — R002 Palpitations: Secondary | ICD-10-CM

## 2023-03-11 DIAGNOSIS — R198 Other specified symptoms and signs involving the digestive system and abdomen: Secondary | ICD-10-CM

## 2023-03-11 DIAGNOSIS — I82409 Acute embolism and thrombosis of unspecified deep veins of unspecified lower extremity: Secondary | ICD-10-CM

## 2023-03-11 DIAGNOSIS — I5022 Chronic systolic (congestive) heart failure: Secondary | ICD-10-CM

## 2023-03-11 DIAGNOSIS — I426 Alcoholic cardiomyopathy: Secondary | ICD-10-CM

## 2023-03-11 DIAGNOSIS — I10 Essential (primary) hypertension: Secondary | ICD-10-CM

## 2023-03-11 DIAGNOSIS — I480 Paroxysmal atrial fibrillation: Secondary | ICD-10-CM

## 2023-03-11 DIAGNOSIS — A0472 Enterocolitis due to Clostridium difficile, not specified as recurrent: Secondary | ICD-10-CM

## 2023-03-11 DIAGNOSIS — Z7901 Long term (current) use of anticoagulants: Secondary | ICD-10-CM

## 2023-03-11 DIAGNOSIS — R079 Chest pain, unspecified: Secondary | ICD-10-CM

## 2023-03-11 DIAGNOSIS — Z86718 Personal history of other venous thrombosis and embolism: Secondary | ICD-10-CM

## 2023-03-11 DIAGNOSIS — J449 Chronic obstructive pulmonary disease, unspecified: Secondary | ICD-10-CM

## 2023-03-11 DIAGNOSIS — M549 Dorsalgia, unspecified: Secondary | ICD-10-CM

## 2023-03-11 DIAGNOSIS — Z8679 Personal history of other diseases of the circulatory system: Secondary | ICD-10-CM

## 2023-03-11 DIAGNOSIS — R238 Other skin changes: Secondary | ICD-10-CM

## 2023-03-11 DIAGNOSIS — I4891 Unspecified atrial fibrillation: Secondary | ICD-10-CM

## 2023-03-11 DIAGNOSIS — R0989 Other specified symptoms and signs involving the circulatory and respiratory systems: Secondary | ICD-10-CM

## 2023-03-11 DIAGNOSIS — I2510 Atherosclerotic heart disease of native coronary artery without angina pectoris: Secondary | ICD-10-CM

## 2023-03-11 DIAGNOSIS — R9439 Abnormal result of other cardiovascular function study: Secondary | ICD-10-CM

## 2023-03-11 DIAGNOSIS — F419 Anxiety disorder, unspecified: Secondary | ICD-10-CM

## 2023-03-11 DIAGNOSIS — E7800 Pure hypercholesterolemia, unspecified: Secondary | ICD-10-CM

## 2023-03-11 DIAGNOSIS — E785 Hyperlipidemia, unspecified: Secondary | ICD-10-CM

## 2023-03-11 MED ORDER — ALPRAZOLAM 0.5 MG PO TAB
0.5 mg | ORAL_TABLET | Freq: Three times a day (TID) | ORAL | 0 refills | Status: AC | PRN
Start: 2023-03-11 — End: ?

## 2023-03-11 NOTE — Assessment & Plan Note
Lab Results   Component Value Date    CHOL 126 02/01/2023    TRIG 86 02/01/2023    HDL 45 02/01/2023    LDL 79 02/01/2023    VLDL 17 02/01/2023    NONHDLCHOL 81 02/01/2023    CHOLHDLC 4.0 11/19/2017      LDL close to goal.

## 2023-03-11 NOTE — Assessment & Plan Note
No AF symptoms.  QTc looks OK on today's EKG.

## 2023-03-11 NOTE — Assessment & Plan Note
BP looks fine on losartan 25 mg/day and spironolactone 12.5 mg/day

## 2023-03-11 NOTE — Progress Notes
Date of Service: 03/11/2023    Jonathan Barr is a 67 y.o. male.       HPI     Jonathan Barr was in the [facility] today for follow-up regarding paroxysmal atrial fibrillation.  He seems to be having a good summer and is staying busy working on a car and in the yard.     He denies any problems with chest discomfort or breathlessness.  He has had no palpitations to suggest recurrent atrial fibrillation.     He knows that alcohol has been a problem for him in the past.  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  He continues to have occasional cocktails, but says that he's very moderate and never had more than 2 drinks.  He denies any TIA or stroke symptoms.            Vitals:    03/11/23 1416   BP: 112/73   BP Source: Arm, Left Upper   Pulse: (!) 48   SpO2: 98%   O2 Device: None (Room air)   PainSc: Zero   Weight: 89 kg (196 lb 3.2 oz)   Height: 185.4 cm (6' 1)     Body mass index is 25.89 kg/m?Marland Kitchen     Past Medical History  Patient Active Problem List    Diagnosis Date Noted    Cervical arthritis 08/26/2021    Cellulitis of left leg 07/12/2018    AKI (acute kidney injury) (HCC) 07/12/2018    History of DVT (deep vein thrombosis) 07/12/2018    Chronic anticoagulation, on rivaroxiban 07/12/2018    Sepsis (HCC) 07/11/2018    Traumatic hematoma of knee 05/31/2018    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  Rare non-sustained SVT.  Rare APD's    12/20/15 TEE with evidence of LA thrombus. Canceled DCCV. Digoxin loaded.  12/2015 - Successful TEE-cardioversion.  Started on Tikosyn.      COPD (chronic obstructive pulmonary disease) (HCC) 12/18/2015    Nocturnal oxygen desaturation - O2 use 12/18/2015    Colon polyp 02/07/2013     12/2012 - Colonoscopy      GERD (gastroesophageal reflux disease) 02/07/2013     12/2012- EGD negative for Barrett's      Pulmonary nodule 02/07/2013     12/2012 - CT-chest 4.7 mm nodular opacity of peripheral left lower lobe.  F/U CT recommended in 6 months.      Essential hypertension 04/24/2011    History of tobacco use, quit in 2012 04/24/2011    History of alcoholic cardiomyopathy      EF  48%  per stress echo 02/29/00.    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  Normal study.      Anxiety     Chest pain 09/24/2006        a. Stress Echo 02/29/00 with mild resting global LV asyneresis. Induced inf and post wall myocardial ischemia      b. 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      c.  Cardiac cath 2/08 - Normal coronaries, normal LV function  Hyperlipidemia          Review of Systems   Constitutional: Negative.   HENT: Negative.     Eyes: Negative.    Cardiovascular:  Positive for dyspnea on exertion, irregular heartbeat and palpitations.   Respiratory:  Positive for shortness of breath.    Endocrine: Negative.    Hematologic/Lymphatic: Negative.    Skin: Negative.    Musculoskeletal: Negative.    Gastrointestinal: Negative.    Genitourinary: Negative.    Neurological:  Positive for dizziness and light-headedness.   Psychiatric/Behavioral: Negative.     Allergic/Immunologic: Negative.        Physical Exam    Physical Exam   General Appearance: no distress   Skin: warm, no ulcers or xanthomas   Digits and Nails: no cyanosis or clubbing   Eyes: conjunctivae and lids normal, pupils are equal and round   Teeth/Gums/Palate: dentition unremarkable, no lesions   Lips & Oral Mucosa: no pallor or cyanosis   Neck Veins: normal JVP , neck veins are not distended   Thyroid: no nodules, masses, tenderness or enlargement   Chest Inspection: chest is normal in appearance   Respiratory Effort: breathing comfortably, no respiratory distress   Auscultation/Percussion: lungs clear to auscultation, no rales or rhonchi, no wheezing   PMI: PMI not enlarged or displaced   Cardiac Rhythm: regular rhythm and normal rate   Cardiac Auscultation: S1, S2 normal, no rub, no gallop   Murmurs: no murmur   Peripheral Circulation: normal peripheral circulation   Carotid Arteries: normal carotid upstroke bilaterally, no bruits   Radial Arteries: normal symmetric radial pulses   Abdominal Aorta: no abdominal aortic bruit   Pedal Pulses: normal symmetric pedal pulses   Lower Extremity Edema: no lower extremity edema   Abdominal Exam: soft, non-tender, no masses, bowel sounds normal   Liver & Spleen: no organomegaly   Gait & Station: walks without assistance   Muscle Strength: normal muscle tone   Orientation: oriented to time, place and person   Affect & Mood: appropriate and sustained affect   Language and Memory: patient responsive and seems to comprehend information   Neurologic Exam: neurological assessment grossly intact   Other: moves all extremities    EKG:  SR, rate 63.  Bigeminal PVCs.      Cardiovascular Health Factors  Vitals BP Readings from Last 3 Encounters:   03/11/23 112/73   02/01/23 116/78   08/27/22 118/76     Wt Readings from Last 3 Encounters:   03/11/23 89 kg (196 lb 3.2 oz)   02/01/23 87.7 kg (193 lb 6.4 oz)   08/27/22 94.1 kg (207 lb 6.4 oz)     BMI Readings from Last 3 Encounters:   03/11/23 25.89 kg/m?   02/01/23 27.69 kg/m?   08/27/22 27.36 kg/m?      Smoking Social History     Tobacco Use   Smoking Status Former    Current packs/day: 0.00    Average packs/day: 0.5 packs/day for 30.0 years (15.0 ttl pk-yrs)    Types: Cigarettes    Start date: 12/05/1980    Quit date: 12/06/2010    Years since quitting: 12.2   Smokeless Tobacco Never   Tobacco Comments    cessation counseling given 07/15/10      Lipid Profile Cholesterol   Date Value Ref Range Status   02/01/2023 126 <200 MG/DL Final     HDL   Date Value Ref Range Status   02/01/2023 45 >40 MG/DL Final  LDL   Date Value Ref Range Status   02/01/2023 79 <100 mg/dL Final     Triglycerides   Date Value Ref Range Status   02/01/2023 86 <150 MG/DL Final      Blood Sugar Hemoglobin A1C   Date Value Ref Range Status   07/24/2016 6.0 4.0 - 6.0 % Final     Comment:     The ADA recommends that most patients with type 1 and type 2 diabetes maintain   an A1c level <7%.       Glucose   Date Value Ref Range Status   02/01/2023 87 70 - 100 MG/DL Final   16/05/9603 91 70 - 100 MG/DL Final   54/04/8118 147  Final     Glucose, POC   Date Value Ref Range Status   07/11/2018 77 70 - 100 MG/DL Final   82/95/6213 65 (L) 70 - 100 MG/DL Final          Problems Addressed Today  Encounter Diagnoses   Name Primary?    Cardiovascular symptoms Yes    Chest pain, unspecified type     Anxiety     Paroxysmal atrial fibrillation (HCC)     Essential hypertension     Pure hypercholesterolemia        Assessment and Plan       Paroxysmal atrial fibrillation (HCC)  No AF symptoms.  QTc looks OK on today's EKG.    Essential hypertension  BP looks fine on losartan 25 mg/day and spironolactone 12.5 mg/day    Hyperlipidemia  Lab Results   Component Value Date    CHOL 126 02/01/2023    TRIG 86 02/01/2023    HDL 45 02/01/2023    LDL 79 02/01/2023    VLDL 17 02/01/2023    NONHDLCHOL 81 02/01/2023    CHOLHDLC 4.0 11/19/2017      LDL close to goal.      Current Medications (including today's revisions)   acetaminophen SR(+) (TYLENOL) 650 mg tablet Take one tablet by mouth every 6 hours as needed for Pain.    albuterol (VENTOLIN HFA, PROAIR HFA, PROVENTIL HFA) 90 mcg/actuation inhaler Inhale two puffs by mouth into the lungs every 6 hours as needed for Wheezing or Shortness of Breath. Shake well before use.    ALPRAZolam (XANAX) 0.5 mg tablet Take one tablet by mouth three times daily as needed.    atorvastatin (LIPITOR) 40 mg tablet Take 1 tablet by mouth once daily    dofetilide (TIKOSYN) 500 mcg capsule Take 1 capsule by mouth twice daily ipratropium-albuterol (COMBIVENT RESPIMAT) 20-100 mcg/actuation mist for inhalation Inhale one puff by mouth into the lungs four times daily.    L.ACID/L.CASEI/B.BIF/B.LON/FOS (PROBIOTIC BLEND PO) Take 1 tablet by mouth every 7 days.    losartan (COZAAR) 25 mg tablet Take one tablet by mouth daily.    Magnesium Oxide 500 mg tab Take one tablet by mouth twice daily.    metoprolol succinate XL (TOPROL XL) 100 mg extended release tablet Take one tablet by mouth daily.    omeprazole DR (PRILOSEC) 20 mg capsule Take one capsule by mouth daily.    rivaroxaban (XARELTO) 20 mg tablet TAKE 1 TABLET BY MOUTH ONCE DAILY WITH SUPPER    spironolactone (ALDACTONE) 25 mg tablet TAKE 1/2 (ONE-HALF) TABLET BY MOUTH ONCE DAILY WITH FOOD    SYMBICORT 160-4.5 mcg/actuation aerosol inhaler Inhale two puffs by mouth into the lungs twice daily.    tamsulosin (FLOMAX) 0.4 mg capsule Take one  capsule by mouth daily. Do not crush, chew or open capsules. Take 30 minutes following the same meal each day.     Total time spent on today's office visit was 30 minutes.  This includes face-to-face in person visit with patient as well as nonface-to-face time including review of the EMR, outside records, labs, radiologic studies, echocardiogram & other cardiovascular studies, formation of treatment plan, after visit summary, future disposition, and lastly on documentation.

## 2023-03-23 ENCOUNTER — Encounter: Admit: 2023-03-23 | Discharge: 2023-03-23 | Payer: MEDICARE

## 2023-03-23 NOTE — Telephone Encounter
Discussed with Dr. Avie Arenas in clinic by Waneta Martins, RN in clinic.  She recommends to hold spironolactone, keep BP log and call us back in about 2 weeks with update.

## 2023-03-23 NOTE — Telephone Encounter
Jonathan Barr called into the nursing line and reports that lately his BP has been running on the lower side, but he had an episode this morning where it got pretty low and was feeling lightheaded/dizzy like he might pass out.  Jonathan Barr states that he woke up feeling okay, took his medications, but then started feeling not so great.  He ate breakfast, sat and watched TV, but continued to feel lightheaded/dizzy.  His BP readings from today are 84/69,  HR 64 - 94/55, HR 66 - 84/65, HR 72, 79/50. HR 68 105/67.  He is feeling better at this time, but states that he has noticed feeling lightheaded a little more often.  He does report that he does occasionally feel some palpitations and DOE, especially in the mornings when he is active, but not really worse than usual.      He is taking his medications as prescribed including: Losartan 25mg  daily. Metoprolol XL 100mg  daily and Spironolactone 12.5mg  daily.    Will have Dr. Avie Arenas review as Dr. Barry Dienes is out of the office.

## 2023-03-25 ENCOUNTER — Encounter: Admit: 2023-03-25 | Discharge: 2023-03-25 | Payer: MEDICARE

## 2023-03-25 DIAGNOSIS — Z8679 Personal history of other diseases of the circulatory system: Secondary | ICD-10-CM

## 2023-03-25 DIAGNOSIS — I480 Paroxysmal atrial fibrillation: Secondary | ICD-10-CM

## 2023-03-25 DIAGNOSIS — B182 Chronic viral hepatitis C: Secondary | ICD-10-CM

## 2023-03-25 DIAGNOSIS — I10 Essential (primary) hypertension: Secondary | ICD-10-CM

## 2023-03-25 LAB — CBC
HEMATOCRIT: 44
HEMOGLOBIN: 14
PLATELET COUNT: 207
RBC COUNT: 4.9
WBC COUNT: 7

## 2023-03-25 LAB — COMPREHENSIVE METABOLIC PANEL
ALBUMIN: 4.3
ALK PHOSPHATASE: 82
ALT: 14
AST: 15
BLD UREA NITROGEN: 25
CHLORIDE: 109 — ABNORMAL HIGH (ref 98–107)
CO2: 22 — ABNORMAL LOW (ref 23–31)
CREATININE: 1.6 — ABNORMAL HIGH (ref 0.720–1.25)
GLUCOSE,PANEL: 99
POTASSIUM: 4.6
SODIUM: 1.4
TOTAL BILIRUBIN: 0.7
TOTAL PROTEIN: 6.6

## 2023-03-25 LAB — BNP (B-TYPE NATRIURETIC PEPTI): BNP: 158 — ABNORMAL HIGH (ref 0–100)

## 2023-03-25 NOTE — Telephone Encounter
Patient called c/o continued hypotension.  He called two days ago and was requested to hold aldactone.  Currently taking losartan 25mg  daily, metoprolol xl 100mg  daily, flomax 0.4mg , tikosyn bid.  Patient c/o mild lightheadedness. Reports bp's this morning of 97/71 P68, 82/68 p68, 101/88 p66, 97/76 p76.  He has been holding aldactone 25mg  for the last coulple of days.  Discussed with Dr Maisie Fus orders received to hold losartan, have lab drawn.  Pt dose state recent hx of diarrhea/soft stools x 2 weeks.  Advised to call pcp in regards to diarrhea.

## 2023-04-06 ENCOUNTER — Encounter: Admit: 2023-04-06 | Discharge: 2023-04-06 | Payer: MEDICARE

## 2023-04-06 DIAGNOSIS — R079 Chest pain, unspecified: Secondary | ICD-10-CM

## 2023-04-06 DIAGNOSIS — F419 Anxiety disorder, unspecified: Secondary | ICD-10-CM

## 2023-04-06 MED ORDER — ALPRAZOLAM 0.5 MG PO TAB
0.5 mg | ORAL_TABLET | Freq: Three times a day (TID) | ORAL | 2 refills | Status: AC | PRN
Start: 2023-04-06 — End: ?

## 2023-04-06 NOTE — Telephone Encounter
Refill approved by [facility], called into pharmacy.

## 2023-05-09 ENCOUNTER — Encounter: Admit: 2023-05-09 | Discharge: 2023-05-09 | Payer: MEDICARE

## 2023-05-09 MED ORDER — ATORVASTATIN 40 MG PO TAB
ORAL_TABLET | 0 refills
Start: 2023-05-09 — End: ?

## 2023-06-09 ENCOUNTER — Encounter: Admit: 2023-06-09 | Discharge: 2023-06-09 | Payer: MEDICARE

## 2023-06-09 NOTE — Telephone Encounter
Person Calling (Patient, Partner, Friend, etc.): Patient    Permission to Communicate on File (Yes or No): NA    Patient Name: Jonathan Barr    DOB: [birth year]    Reason for Call: Rib cage pain    Onset of Symptoms: Two weeks     Summary: Jonathan Barr reports some intercostal left rib cage pain the last couple weeks. He states it may be from strenuous exercise because he was recently digging post holes and does a lot of labor. He denies other symptoms with it but does have a history of paroxysmal afib, HTN, and chest pain. He states he has been taking his HTN medications as prescribed as well as his Xarelto. Reports BP ranges when he first wakes up in the morning as 130-140s/90-110s and after medication states his BP ranges are 100s/70s with HR 60s. At this time, triage RN determines rib cage pain to be non-cardiac related but gave patient strict instructions to present to ED for any chest pain and SOB. Patient voices understanding. Patient does have some scheduling restraints due to living far away and some vehicle trouble.     Disposition Given: See PCP Within 2 Weeks    Following Disposition (Yes or No): Yes     Reason for Disposition   Back pain lasts > 2 weeks    Answer Assessment - Initial Assessment Questions  1. ONSET: When did the pain begin?       3-4 weeks  2. LOCATION: Where does it hurt? (upper, mid or lower back)      Left side rib cage  3. SEVERITY: How bad is the pain?  (e.g., Scale 1-10; mild, moderate, or severe)    - MILD (1-3): doesn't interfere with normal activities     - MODERATE (4-7): interferes with normal activities or awakens from sleep     - SEVERE (8-10): excruciating pain, unable to do any normal activities       Sore pain, 4/10  4. PATTERN: Is the pain constant? (e.g., yes, no; constant, intermittent)       constant   5. RADIATION: Does the pain shoot into your legs or elsewhere?      Denies  6. CAUSE:  What do you think is causing the back pain?       Unsure  7. BACK OVERUSE:  Any recent lifting of heavy objects, strenuous work or exercise?      Was digging post holes and does strenuous exercise   8. MEDICATIONS: What have you taken so far for the pain? (e.g., nothing, acetaminophen, NSAIDS)      Aspirin  9. NEUROLOGIC SYMPTOMS: Do you have any weakness, numbness, or problems with bowel/bladder control?      Denies  10. OTHER SYMPTOMS: Do you have any other symptoms? (e.g., fever, abdominal pain, burning with urination, blood in urine)        Denies  11. PREGNANCY: Is there any chance you are pregnant? (e.g., yes, no; LMP)        NA    Protocols used: Back Pain-A-[place]

## 2023-06-09 NOTE — Telephone Encounter
Jonathan Barr  P [facility] T[facility] Nurse (supporting Raeanne Gathers, MD)24 minutes ago (10:03 AM)       Wondering what lung had the spot on it when x ray taken. My left side been sore, like a separated cartilage on ribs when laying in bed over night..thank you.

## 2023-06-17 ENCOUNTER — Encounter: Admit: 2023-06-17 | Discharge: 2023-06-17 | Payer: MEDICARE

## 2023-07-16 ENCOUNTER — Encounter: Admit: 2023-07-16 | Discharge: 2023-07-16 | Payer: MEDICARE

## 2023-09-03 ENCOUNTER — Encounter: Admit: 2023-09-03 | Discharge: 2023-09-03 | Payer: MEDICARE

## 2023-09-03 DIAGNOSIS — I5022 Chronic systolic (congestive) heart failure: Secondary | ICD-10-CM

## 2023-09-03 DIAGNOSIS — E7800 Pure hypercholesterolemia, unspecified: Secondary | ICD-10-CM

## 2023-09-03 DIAGNOSIS — I4819 Other persistent atrial fibrillation: Secondary | ICD-10-CM

## 2023-09-03 DIAGNOSIS — K219 Gastro-esophageal reflux disease without esophagitis: Secondary | ICD-10-CM

## 2023-09-03 DIAGNOSIS — I513 Intracardiac thrombosis, not elsewhere classified: Secondary | ICD-10-CM

## 2023-09-03 DIAGNOSIS — J449 Chronic obstructive pulmonary disease, unspecified: Secondary | ICD-10-CM

## 2023-09-03 DIAGNOSIS — G4734 Idiopathic sleep related nonobstructive alveolar hypoventilation: Secondary | ICD-10-CM

## 2023-09-03 DIAGNOSIS — I10 Essential (primary) hypertension: Secondary | ICD-10-CM

## 2023-09-03 MED ORDER — DOFETILIDE 500 MCG PO CAP
500 ug | ORAL_CAPSULE | Freq: Two times a day (BID) | ORAL | 3 refills | Status: AC
Start: 2023-09-03 — End: ?

## 2023-09-13 ENCOUNTER — Encounter: Admit: 2023-09-13 | Discharge: 2023-09-13 | Payer: MEDICARE

## 2023-09-22 ENCOUNTER — Encounter: Admit: 2023-09-22 | Discharge: 2023-09-22 | Payer: MEDICARE

## 2023-09-27 NOTE — Assessment & Plan Note
Tikosyn 500 mcg BID--started in 2017 after most recent cardioversion  Xarelto 20/d

## 2023-09-28 ENCOUNTER — Encounter: Admit: 2023-09-28 | Discharge: 2023-09-28 | Payer: MEDICARE

## 2023-09-28 ENCOUNTER — Ambulatory Visit: Admit: 2023-09-28 | Discharge: 2023-09-29 | Payer: MEDICARE

## 2023-09-28 DIAGNOSIS — I480 Paroxysmal atrial fibrillation: Secondary | ICD-10-CM

## 2023-09-28 DIAGNOSIS — I10 Essential (primary) hypertension: Secondary | ICD-10-CM

## 2023-09-28 NOTE — Progress Notes
Date of Service: 09/28/2023    Jonathan Barr is a 68 y.o. male.       HPI     Jonathan Barr was in the [place] clinic today for follow-up regarding paroxysmal atrial fibrillation.     He denies any problems with chest discomfort or breathlessness.  He has had no palpitations to suggest recurrent atrial fibrillation.     He knows that alcohol has been a problem for him in the past.  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  He continues to have occasional cocktails, but says that he's very moderate and never had more than 2 drinks.  He denies any TIA or stroke symptoms.    He was having a lot of trouble with postural light headedness and low BP.  We stopped losartan, spironolactone, and Flomax.  His BP is better, but he still has occasional light-headed episodes in the early morning.         Vitals:    09/28/23 0919   BP: (!) 131/97   BP Source: Arm, Left Upper   Pulse: 64   SpO2: 98%   O2 Device: None (Room air)   PainSc: Zero   Weight: 93 kg (205 lb)   Height: 185.4 cm (6' 1)     Body mass index is 27.05 kg/m?Marland Kitchen     Past Medical History  Patient Active Problem List    Diagnosis Date Noted    Cervical arthritis 08/26/2021    Cellulitis of left leg 07/12/2018    AKI (acute kidney injury) (HCC) 07/12/2018    History of DVT (deep vein thrombosis) 07/12/2018    Chronic anticoagulation, on rivaroxiban 07/12/2018    Sepsis (HCC) 07/11/2018    Traumatic hematoma of knee 05/31/2018    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  Rare non-sustained SVT.  Rare APD's    12/20/15 TEE with evidence of LA thrombus. Canceled DCCV. Digoxin loaded.  12/2015 - Successful TEE-cardioversion.  Started on Tikosyn.      COPD (chronic obstructive pulmonary disease) (HCC) 12/18/2015    Nocturnal oxygen desaturation - O2 use 12/18/2015    Colon polyp 02/07/2013     12/2012 - Colonoscopy      GERD (gastroesophageal reflux disease) 02/07/2013     12/2012- EGD negative for Barrett's      Pulmonary nodule 02/07/2013     12/2012 - CT-chest 4.7 mm nodular opacity of peripheral left lower lobe.  F/U CT recommended in 6 months.      Essential hypertension 04/24/2011    History of tobacco use, quit in 2012 04/24/2011    History of alcoholic cardiomyopathy      EF  48%  per stress echo 02/29/00.    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  Normal study.      Anxiety     Chest pain 09/24/2006        a. Stress Echo 02/29/00 with mild resting global LV asyneresis. Induced inf and post wall myocardial ischemia      b. 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      c.  Cardiac  cath 2/08 - Normal coronaries, normal LV function        Hyperlipidemia          Review of Systems   Constitutional: Negative.   HENT: Negative.     Eyes: Negative.    Cardiovascular: Negative.    Respiratory: Negative.     Endocrine: Negative.    Hematologic/Lymphatic: Negative.    Skin: Negative.    Musculoskeletal: Negative.    Gastrointestinal: Negative.    Genitourinary: Negative.    Neurological: Negative.    Psychiatric/Behavioral: Negative.     Allergic/Immunologic: Negative.        Physical Exam    Physical Exam   General Appearance: no distress   Skin: warm, no ulcers or xanthomas   Digits and Nails: no cyanosis or clubbing   Eyes: conjunctivae and lids normal, pupils are equal and round   Teeth/Gums/Palate: dentition unremarkable, no lesions   Lips & Oral Mucosa: no pallor or cyanosis   Neck Veins: normal JVP , neck veins are not distended   Thyroid: no nodules, masses, tenderness or enlargement   Chest Inspection: chest is normal in appearance   Respiratory Effort: breathing comfortably, no respiratory distress   Auscultation/Percussion: lungs clear to auscultation, no rales or rhonchi, no wheezing   PMI: PMI not enlarged or displaced   Cardiac Rhythm: regular rhythm and normal rate   Cardiac Auscultation: S1, S2 normal, no rub, no gallop   Murmurs: no murmur   Peripheral Circulation: normal peripheral circulation   Carotid Arteries: normal carotid upstroke bilaterally, no bruits   Radial Arteries: normal symmetric radial pulses   Abdominal Aorta: no abdominal aortic bruit   Pedal Pulses: normal symmetric pedal pulses   Lower Extremity Edema: no lower extremity edema   Abdominal Exam: soft, non-tender, no masses, bowel sounds normal   Liver & Spleen: no organomegaly   Gait & Station: walks without assistance   Muscle Strength: normal muscle tone   Orientation: oriented to time, place and person   Affect & Mood: appropriate and sustained affect   Language and Memory: patient responsive and seems to comprehend information   Neurologic Exam: neurological assessment grossly intact   Other: moves all extremities      Cardiovascular Health Factors  Vitals BP Readings from Last 3 Encounters:   09/28/23 (!) 131/97   03/11/23 112/73   02/01/23 116/78     Wt Readings from Last 3 Encounters:   09/28/23 93 kg (205 lb)   03/11/23 89 kg (196 lb 3.2 oz)   02/01/23 87.7 kg (193 lb 6.4 oz)     BMI Readings from Last 3 Encounters:   09/28/23 27.05 kg/m?   03/11/23 25.89 kg/m?   02/01/23 27.69 kg/m?      Smoking Social History     Tobacco Use   Smoking Status Former    Current packs/day: 0.00    Average packs/day: 0.5 packs/day for 30.0 years (15.0 ttl pk-yrs)    Types: Cigarettes    Start date: 12/05/1980    Quit date: 12/06/2010    Years since quitting: 12.8   Smokeless Tobacco Never   Tobacco Comments    cessation counseling given 07/15/10      Lipid Profile Cholesterol   Date Value Ref Range Status   02/01/2023 126 <200 MG/DL Final     HDL   Date Value Ref Range Status   02/01/2023 45 >40 MG/DL Final     LDL   Date Value Ref Range Status   02/01/2023  79 <100 mg/dL Final     Triglycerides   Date Value Ref Range Status   02/01/2023 86 <150 MG/DL Final      Blood Sugar Hemoglobin A1C   Date Value Ref Range Status   07/24/2016 6.0 4.0 - 6.0 % Final     Comment:     The ADA recommends that most patients with type 1 and type 2 diabetes maintain   an A1c level <7%.       Glucose   Date Value Ref Range Status   03/25/2023 99  Final   02/01/2023 87 70 - 100 MG/DL Final   28/41/3244 91 70 - 100 MG/DL Final     Glucose, POC   Date Value Ref Range Status   07/11/2018 77 70 - 100 MG/DL Final   08/19/7251 65 (L) 70 - 100 MG/DL Final          Problems Addressed Today  Encounter Diagnoses   Name Primary?    Paroxysmal atrial fibrillation (HCC) Yes    Essential hypertension        Assessment and Plan       Paroxysmal atrial fibrillation (HCC)  Tikosyn 500 mcg BID--started in 2017 after most recent cardioversion  Xarelto 20/d    Essential hypertension  He's off losartan, spironolactone, and Flomax due to low BP and light headedness.  BP at home tends to be about 110/80.    Current Medications (including today's revisions)   acetaminophen SR(+) (TYLENOL) 650 mg tablet Take one tablet by mouth every 6 hours as needed for Pain.    albuterol (VENTOLIN HFA, PROAIR HFA, PROVENTIL HFA) 90 mcg/actuation inhaler Inhale two puffs by mouth into the lungs every 6 hours as needed for Wheezing or Shortness of Breath. Shake well before use.    ALPRAZolam (XANAX) 0.5 mg tablet Take 1 tablet by mouth three times daily as needed    atorvastatin (LIPITOR) 40 mg tablet Take 1 tablet by mouth once daily    dofetilide (TIKOSYN) 500 mcg capsule Take 1 capsule by mouth twice daily    ipratropium-albuterol (COMBIVENT RESPIMAT) 20-100 mcg/actuation mist for inhalation Inhale one puff by mouth into the lungs four times daily.    L.ACID/L.CASEI/B.BIF/B.LON/FOS (PROBIOTIC BLEND PO) Take 1 tablet by mouth every 7 days.    Magnesium Oxide 500 mg tab Take one tablet by mouth twice daily. metoprolol succinate XL (TOPROL XL) 100 mg extended release tablet Take one tablet by mouth daily.    omeprazole DR (PRILOSEC) 20 mg capsule Take one capsule by mouth daily.    rivaroxaban (XARELTO) 20 mg tablet TAKE 1 TABLET BY MOUTH ONCE DAILY WITH SUPPER    SYMBICORT 160-4.5 mcg/actuation aerosol inhaler Inhale two puffs by mouth into the lungs twice daily.     Total time spent on today's office visit was 30 minutes.  This includes face-to-face in person visit with patient as well as nonface-to-face time including review of the EMR, outside records, labs, radiologic studies, echocardiogram & other cardiovascular studies, formation of treatment plan, after visit summary, future disposition, and lastly on documentation.

## 2023-10-26 ENCOUNTER — Encounter: Admit: 2023-10-26 | Discharge: 2023-10-26 | Payer: MEDICARE

## 2023-11-15 ENCOUNTER — Encounter: Admit: 2023-11-15 | Discharge: 2023-11-15

## 2023-11-15 NOTE — Telephone Encounter
 Person Calling (Patient, Partner, Friend, etc.): Called patient  Permission to Communicate on File N/A  Jonathan Barr / March 30, 1956    Chief Complaint   Patient presents with    Sore Throat      Onset of Symptoms: 10/29/23    Summary: Patient with sore throat and white patches around his dentures and left side of throat. Pain to the left ear and side of throat in the mornings is scale 8/10 and later in the day 5/10. Patient was seen by an [place] urgent care who thought patient has thrush - prescribed a mouth rinse 4 times a day and antibiotics. Patient reporting he is not noticing any difference in his symptoms. Advised patient to call the urgent care he went to on 11/13/23 and ask how long before medications he was prescribed should make a difference in his symptoms. Advised he return there to be seen if they recommend being seen again. Home care advice included possibly using Biotene at night for his dry mouth and throat, or 2 teaspoons of honey at bedtime. Also gave patient the name of the ENT provider, Jonathan Barr, who saw patient back in 2019 for a sore throat and gave him the clinic number [phone].    Disposition: Immediate office evaluation - offered appointment today at 1400. Patient declined. Lives in [place]. Asking for an appointment on 11/22/23 when his wife has an appointment in [facility]. None available. Patient asked for an appointment then on 11/24/23 1130 and will place himself on the wait list.    Following Disposition: No - patient not wanting to make the trip to [facility] - asking for other options.    Reason for Disposition   SEVERE sore throat pain    Answer Assessment - Initial Assessment Questions  1. ONSET: When did the throat start hurting? (Hours or days ago)       10/29/23 started as itching - went to urgent care in [place] - thought he had thrush - treated with antibiotics and flushing mouth    2. SEVERITY: How bad is the sore throat? (Scale 1-10; mild, moderate or severe)      Severely sore on left side of throat and ear - mornings 8/10 - now 5/10    3. STREP EXPOSURE: Has there been any exposure to strep within the past week? If Yes, ask: What type of contact occurred?       No    4.  VIRAL SYMPTOMS: Are there any symptoms of a cold, such as a runny nose, cough, hoarse voice or red eyes?       White edges to around his dentures    5. FEVER: Do you have a fever? If Yes, ask: What is your temperature, how was it measured, and when did it start?      No    6. PUS ON THE TONSILS: Is there pus on the tonsils in the back of your throat?      White patches around dentures and on edge of tongue    7. OTHER SYMPTOMS: Do you have any other symptoms? (e.g., difficulty breathing, headache, rash)      Headaches - seasonal allergies    8. PREGNANCY: Is there any chance you are pregnant? When was your last menstrual period?      NA    Protocols used: Sore Throat-A-OH

## 2023-11-15 NOTE — Telephone Encounter
 LVM @ 0932 reporting sore throat x 3 weeks.

## 2023-11-24 ENCOUNTER — Encounter: Admit: 2023-11-24 | Discharge: 2023-11-24

## 2023-12-07 ENCOUNTER — Encounter: Admit: 2023-12-07 | Discharge: 2023-12-07 | Payer: MEDICARE

## 2023-12-21 ENCOUNTER — Encounter: Admit: 2023-12-21 | Discharge: 2023-12-21 | Payer: MEDICARE

## 2023-12-21 MED ORDER — COMBIVENT RESPIMAT 20-100 MCG/ACTUATION IN MIST
RESPIRATORY_TRACT | 0 refills | 30.00000 days | Status: AC
Start: 2023-12-21 — End: ?

## 2023-12-21 MED ORDER — SYMBICORT 160-4.5 MCG/ACTUATION IN HFAA
2 | Freq: Two times a day (BID) | RESPIRATORY_TRACT | 0 refills | 30.00000 days | Status: AC
Start: 2023-12-21 — End: ?

## 2023-12-21 NOTE — Telephone Encounter
 Per protocol, ok to refill

## 2024-01-07 ENCOUNTER — Encounter: Admit: 2024-01-07 | Discharge: 2024-01-07 | Payer: MEDICARE

## 2024-01-07 DIAGNOSIS — I4891 Unspecified atrial fibrillation: Secondary | ICD-10-CM

## 2024-01-07 MED ORDER — SYMBICORT 160-4.5 MCG/ACTUATION IN HFAA
2 | Freq: Two times a day (BID) | RESPIRATORY_TRACT | 0 refills | 30.00000 days | Status: AC
Start: 2024-01-07 — End: ?

## 2024-01-07 MED ORDER — METOPROLOL SUCCINATE 100 MG PO TB24
100 mg | ORAL_TABLET | Freq: Every day | ORAL | 0 refills | 90.00000 days | Status: AC
Start: 2024-01-07 — End: ?

## 2024-02-01 ENCOUNTER — Encounter: Admit: 2024-02-01 | Discharge: 2024-02-01 | Payer: MEDICARE

## 2024-02-01 ENCOUNTER — Ambulatory Visit: Admit: 2024-02-01 | Discharge: 2024-02-01 | Payer: MEDICARE

## 2024-02-01 DIAGNOSIS — Z872 Personal history of diseases of the skin and subcutaneous tissue: Secondary | ICD-10-CM

## 2024-02-01 DIAGNOSIS — Z0000 Encounter for general adult medical examination without abnormal findings: Secondary | ICD-10-CM

## 2024-02-01 DIAGNOSIS — I10 Essential (primary) hypertension: Secondary | ICD-10-CM

## 2024-02-01 DIAGNOSIS — J449 Chronic obstructive pulmonary disease, unspecified: Secondary | ICD-10-CM

## 2024-02-01 DIAGNOSIS — Z125 Encounter for screening for malignant neoplasm of prostate: Secondary | ICD-10-CM

## 2024-02-01 DIAGNOSIS — E785 Hyperlipidemia, unspecified: Secondary | ICD-10-CM

## 2024-02-01 DIAGNOSIS — I4891 Unspecified atrial fibrillation: Secondary | ICD-10-CM

## 2024-02-01 MED ORDER — CEPHALEXIN 500 MG PO CAP
500 mg | ORAL_CAPSULE | Freq: Three times a day (TID) | ORAL | 0 refills | 7.00000 days | Status: AC
Start: 2024-02-01 — End: ?

## 2024-02-01 NOTE — Progress Notes
 Date of Service: 02/01/2024    Jonathan Barr is a 68 y.o. male. DOB: 1956/05/09   MRN#: [id number]    Subjective:       68 yo male here for an annual wellness visit  Hx of afib- hyperlipidemia- COPD- htn-cellulitis of his left leg  Reviewed hx in epic briefly  Reviewed HRA  Health Risk Assessment Questionnaire  Current Care  List of Providers you have seen in the last two years: Sharlet Cris Balloon  Are you receiving home health?: No  During the past 4 weeks, how would you rate your health in general?: Good    Outside Care  Since your last PCP visit, have you received care outside of [facility]?: No          Physical Activity  Do you exercise or are you physically active?: Yes  How many days a week do you usually exercise or are physically active?: 6  On days when you exercised or were physically active, how many minutes was the activity?: 6  During the past four weeks, what was the hardest physical activity you could do for at least two minutes?: Heavy    Diet  In the past month, were you worried whether your food would run out before you or your family had money to buy more?: No  In the past 7 days, how many times did you eat fast food or junk food or pizza?: 0  In the past 7 days, how many servings of fruits or vegetables did you eat each day?: (!) 2-3  In the past 7 days, how many sodas and sugar sweetened drinks (regular, not diet) did you drink each day?: (!) 3 or more sweet drinks    Smoke/Tobacco Use  Are you currently a smoker?: No       Alcohol Use  Do you drink alcohol?: Yes  Are you Male or Male?: Male  Male: In the last three months, have you had >3 alcoholic beverages in any one day or >14 in any one week?: No       Depression Screen  Little interest or pleasure in doing things: Not at all  Feeling down, depressed or hopeless: Several days    Pain  How would you rate your pain today?: Very mild pain    Ambulation  Do you use any assistive devices for ambulation?: No Fall Risk  Does it take you longer than 30 seconds to get up and out of a chair?: No  Have you fallen in the past year?: No  Fall History (last 21mo): No Falls    Motor Vehicle Safety  Do you fasten your seat belt when you are in the car?: Yes    Sun Exposure  Do you protect yourself from the sun? For example, wear sunscreen when outside.: (!) No    Hearing Loss  Do you have trouble hearing the television or radio when others do not?: (!) Yes   Do you have to strain or struggle to hear/understand conversation?: (!) Yes  Do you use hearing aids?: (!) Yes    Urinary Incontinence  Have you had urine leakage in the past 6 months?: No       Cognitive Impairment  During the past 12 months, have you experienced confusion or memory loss that is happening more often or is getting worse?: No    Functional Screen  Do you live alone?: No  Do you live at: Home  Can you  drive your own car or travel alone by bus or taxi?: Yes  Can you shop for groceries or clothes without help?: Yes  Can you prepare your own meals?: Yes  Can you do your own housework without help?: Yes  Can you handle your own money without help?: Yes  Do you need help eating, bathing, dressing, or getting around your home?: No  Do you feel safe?: Yes  Does anyone at home hurt you, hit you, or threaten you?: No  Have you ever been the victim of abuse?: No    Home Safety  Does your home have grab bars in the bathroom?: Yes  Does your home have hand rails on stairs and steps?: Yes  Does your home have functioning smoke alarms?: Yes    Advance Directive  Do you have a living will or Advance Directive?: (!) No  Are you interested in discussing the importance of a living will or Advance Directive?: (P) No    Dental Screen  Have you had an exam by your dentist in the last year?: (!) No    Vision Screen  Do you have diabetes?: No                     Review of Systems   Constitutional: Negative for chills and fever.        Wt up 11 lbs since 6-24  Getting some exercise   Occ sweating    HENT:  Positive for tinnitus. Negative for hearing loss.         No recent URIs  Has a denture on top and a partial on the bottom   Eyes:         Has glasses - vision in his left eye is not as good as in his rt eye   Had some rt eye injections yrs ago   Cardiovascular:  Positive for leg swelling. Negative for chest pain and palpitations.   Respiratory:          Hx of COPD- coughs in the am  Some DOE with mowing    Hematologic/Lymphatic: Bruises/bleeds easily.   Skin:         Some bruising   His left lower leg is a little pink   Musculoskeletal:  Positive for back pain, joint pain and neck pain.        Has knee pain   Gastrointestinal:  Positive for diarrhea and heartburn. Negative for constipation, hematochezia, melena, nausea and vomiting.        Occ umbilical pain   Genitourinary:  Negative for hematuria.        Occ dysuria   Has nocturia    Neurological:  Positive for headaches.   Psychiatric/Behavioral:          Stress level is ok overall   Allergic/Immunologic:        Has eye itching         Objective:      acetaminophen SR(+) (TYLENOL) 650 mg tablet Take one tablet by mouth every 6 hours as needed for Pain.    albuterol (VENTOLIN HFA, PROAIR HFA, PROVENTIL HFA) 90 mcg/actuation inhaler Inhale two puffs by mouth into the lungs every 6 hours as needed for Wheezing or Shortness of Breath. Shake well before use.    ALPRAZolam (XANAX) 0.5 mg tablet Take 1 tablet by mouth three times daily as needed    atorvastatin (LIPITOR) 40 mg tablet Take 1 tablet by mouth once daily    COMBIVENT  RESPIMAT 20-100 mcg/actuation mist for inhalation INHALE 1 PUFF BY MOUTH 4 TIMES DAILY    dofetilide (TIKOSYN) 500 mcg capsule Take 1 capsule by mouth twice daily    L.ACID/L.CASEI/B.BIF/B.LON/FOS (PROBIOTIC BLEND PO) Take 1 tablet by mouth every 7 days.    Magnesium Oxide 500 mg tab Take one tablet by mouth twice daily.    metoprolol succinate XL (TOPROL XL) 100 mg extended release tablet Take 1 tablet by mouth once daily    omeprazole DR (PRILOSEC) 20 mg capsule Take one capsule by mouth daily.    rivaroxaban (XARELTO) 20 mg tablet TAKE 1 TABLET BY MOUTH ONCE DAILY WITH SUPPER    SYMBICORT 160-4.5 mcg/actuation aerosol inhaler Inhale 2 puffs by mouth twice daily     Vitals:    02/01/24 1300   BP: (!) 143/91   BP Source: Arm, Right Upper   Pulse: 57   Temp: 36.2 ?C (97.2 ?F)   Resp: 16   SpO2: 95%   TempSrc: Temporal   PainSc: Zero   Weight: 92.9 kg (204 lb 11.2 oz)   Height: 185.4 cm (6' 0.99)     Body mass index is 27.01 kg/m?[name]     Physical Exam  Vitals and nursing note reviewed.   Constitutional:       General: He is not in acute distress.     Appearance: Normal appearance. He is well-developed. He is not ill-appearing.      Comments: Bp was 135/90 on recheck   HENT:      Head: Normocephalic and atraumatic.      Right Ear: Tympanic membrane and ear canal normal.      Left Ear: Tympanic membrane and ear canal normal.      Mouth/Throat:      Mouth: Mucous membranes are moist.      Pharynx: Oropharynx is clear.   Eyes:      Conjunctiva/sclera: Conjunctivae normal.      Pupils: Pupils are equal, round, and reactive to light.   Neck:      Trachea: No tracheal deviation.   Cardiovascular:      Rate and Rhythm: Normal rate and regular rhythm.      Heart sounds: No murmur heard.  Pulmonary:      Effort: Pulmonary effort is normal.      Breath sounds: Normal breath sounds.   Abdominal:      General: Bowel sounds are normal. There is no distension.      Palpations: Abdomen is soft. There is no mass.      Tenderness: There is no abdominal tenderness.   Musculoskeletal:      Cervical back: Neck supple.      Comments: Has bilateral sock line edema   Lymphadenopathy:      Cervical: No cervical adenopathy.   Skin:     General: Skin is warm and dry.      Comments: Is left lower leg is a little pink   Neurological:      General: No focal deficit present.      Mental Status: He is alert and oriented to person, place, and time. Cranial Nerves: No cranial nerve deficit.   Psychiatric:         Mood and Affect: Mood normal.         Behavior: Behavior normal.         Thought Content: Thought content normal.         Judgment: Judgment normal.     A/p  Annual wellness  visit- not smoking -bmi over goal at 50- pt will let me know when ready to have a colonoscopy ordered- is due- is overdue for several vaccines- did receive a prevnar 20 last yr  Afib-on xarelto -tikosyn - and toprol xl  Hyperlipidemia- check lab- on lipitor  COPD- on albuterol- symbicort and combivent- not smoking  Screening psa  Htn- bp was 135/90 - monitor at home - if remains elevated - will start losartan 12.5 mg per day   Hx of cellulitis of LE- printed a back up keflex rx to fill if leg got more red  Rtc in 1 yr and prn

## 2024-02-02 ENCOUNTER — Encounter: Admit: 2024-02-02 | Discharge: 2024-02-02 | Payer: MEDICARE

## 2024-03-19 ENCOUNTER — Encounter: Admit: 2024-03-19 | Discharge: 2024-03-19 | Payer: MEDICARE

## 2024-03-19 MED ORDER — COMBIVENT RESPIMAT 20-100 MCG/ACTUATION IN MIST
RESPIRATORY_TRACT | 3 refills | 30.00000 days | Status: AC
Start: 2024-03-19 — End: ?

## 2024-04-03 ENCOUNTER — Encounter: Admit: 2024-04-03 | Discharge: 2024-04-03 | Payer: MEDICARE

## 2024-04-03 DIAGNOSIS — I4891 Unspecified atrial fibrillation: Principal | ICD-10-CM

## 2024-04-03 MED ORDER — METOPROLOL SUCCINATE 100 MG PO TB24
100 mg | ORAL_TABLET | Freq: Every day | ORAL | 3 refills | 90.00000 days | Status: AC
Start: 2024-04-03 — End: ?

## 2024-04-03 NOTE — Telephone Encounter
 Lrf 01/07/24 metoprolol  succinate xl 100mg  qty 90  Lov 01/2024 awv

## 2024-04-04 ENCOUNTER — Encounter: Admit: 2024-04-04 | Discharge: 2024-04-04 | Payer: MEDICARE

## 2024-04-04 ENCOUNTER — Ambulatory Visit: Admit: 2024-04-04 | Discharge: 2024-04-04 | Payer: MEDICARE

## 2024-04-04 DIAGNOSIS — I10 Essential (primary) hypertension: Secondary | ICD-10-CM

## 2024-04-04 DIAGNOSIS — R0989 Other specified symptoms and signs involving the circulatory and respiratory systems: Secondary | ICD-10-CM

## 2024-04-04 DIAGNOSIS — E7800 Pure hypercholesterolemia, unspecified: Secondary | ICD-10-CM

## 2024-04-04 DIAGNOSIS — I4891 Unspecified atrial fibrillation: Secondary | ICD-10-CM

## 2024-04-04 DIAGNOSIS — F419 Anxiety disorder, unspecified: Secondary | ICD-10-CM

## 2024-04-04 DIAGNOSIS — R079 Chest pain, unspecified: Secondary | ICD-10-CM

## 2024-04-04 DIAGNOSIS — I480 Paroxysmal atrial fibrillation: Principal | ICD-10-CM

## 2024-04-04 DIAGNOSIS — Z8679 Personal history of other diseases of the circulatory system: Secondary | ICD-10-CM

## 2024-04-04 MED ORDER — ALPRAZOLAM 0.5 MG PO TAB
0.5 mg | ORAL_TABLET | Freq: Three times a day (TID) | ORAL | 3 refills | 30.00000 days | Status: AC | PRN
Start: 2024-04-04 — End: ?

## 2024-04-04 MED ORDER — RIVAROXABAN 20 MG PO TAB
20 mg | ORAL_TABLET | Freq: Every day | ORAL | 3 refills | 30.00000 days | Status: AC
Start: 2024-04-04 — End: ?

## 2024-04-04 MED ORDER — METOPROLOL SUCCINATE 100 MG PO TB24
100 mg | ORAL_TABLET | Freq: Every day | ORAL | 3 refills | 90.00000 days | Status: AC
Start: 2024-04-04 — End: ?

## 2024-04-04 NOTE — Assessment & Plan Note
 Lab Results   Component Value Date    CHOL 113 02/01/2024    TRIG 76 02/01/2024    HDL 39 (L) 02/01/2024    LDL 65 02/01/2024    VLDL 84.7 02/01/2024    NONHDLCHOL 74 02/01/2024    CHOLHDLC 4.0 11/19/2017      Atorva 40/day.

## 2024-04-04 NOTE — Progress Notes
 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 [place] [address]  Insurance Coverage: [facility] MEDICARE REPLACEMENT - [id number]  Insurance ID: [id number]  Insurance Group #: [id number]  Insurance Subscriber: [name],[name]  Implanted Cardiac Device Information: No results found for: [email or web address]      Patient instructed to contact company phone number on the monitor box with questions regarding billing, placement, troubleshooting.     [name]    ____________________________________________________________    Clinic Staff:    Complete additional steps for documentation double check/Co-Sign.  In Follow-up, send chart upon closing encounter to [facility] AMBULATORY MONITORS    [facility] Ambulatory Monitoring Team:  Schedule on appropriate template and check-in.   Clinic Placement Schedule on clinic location [facility] schedule   Home Enrollment Schedule on Home Enrollment schedule ([facility])   Given to patient in clinic for self-placement Schedule on Home Enrollment schedule ([facility])   Inpatient Schedule on [facility] AMBULATORY MONITORING template   2. Please enroll with appropriate vendor.

## 2024-04-04 NOTE — Assessment & Plan Note
 Last echo was in 2019, EF 55%.  He's not completely abstinent, but consumes much less alcohol than he did 25 years ago.

## 2024-04-04 NOTE — Progress Notes
 Date of Service: 04/04/2024    Jonathan Barr is a 68 y.o. male.       HPI     Jonathan Barr was in the [place] [facility] clinic today for follow-up regarding paroxysmal atrial fibrillation.  He is in atrial fibrillation and doesn't feel well today.  He doesn't specifically perceive a rapid, irregular heart rate.    He's been going through a lot of stress related to his daughter.  She and her husband probably both still have problems with substance abuse.  They have 2 kids and 2 dogs.  They've been bouncing from one bad housing situation to another in [place], usually getting kicked out for not paying rent.  The daughter, kids, and dogs are now staying with Jonathan Barr and his wife here in [place].     He denies any problems with chest discomfort or breathlessness.  His BP has been elevated at times.  If he can get away to himself and relax his BP will come down.     He knows that alcohol has been a problem for him in the past.  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  He continues to have occasional cocktails, but says that he's very moderate and never had more than 2 drinks.  He denies any TIA or stroke symptoms.     He was having a lot of trouble with postural light headedness and low BP.  We stopped losartan , spironolactone , and Flomax .  His BP is better, but he still has occasional light-headed episodes in the early morning.            Vitals:    04/04/24 0933   BP: (!) 149/115   BP Source: Arm, Left Upper   Pulse: (!) 124   SpO2: 99%   O2 Device: None (Room air)   PainSc: Zero   Weight: 92.8 kg (204 lb 9.6 oz)   Height: 185.4 cm (6' 1)     Body mass index is 26.99 kg/m?Jonathan Barr     Past Medical History  Patient Active Problem List    Diagnosis Date Noted    Cervical arthritis 08/26/2021    Cellulitis of left leg 07/12/2018    AKI (acute kidney injury) 07/12/2018    History of DVT (deep vein thrombosis) 07/12/2018    Chronic anticoagulation, on rivaroxiban 07/12/2018    Hx of sepsis 07/11/2018    Traumatic hematoma of knee 05/31/2018    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  Rare non-sustained SVT.  Rare APD's    12/20/15 TEE with evidence of LA thrombus. Canceled DCCV. Digoxin  loaded.  12/2015 - Successful TEE-cardioversion.  Started on Tikosyn .      COPD (chronic obstructive pulmonary disease) (CMS-HCC) 12/18/2015    Nocturnal oxygen desaturation - O2 use 12/18/2015    Colon polyp 02/07/2013     12/2012 - Colonoscopy      GERD (gastroesophageal reflux disease) 02/07/2013     12/2012- EGD negative for Barrett's      Pulmonary nodule 02/07/2013     12/2012 - CT-chest 4.7 mm nodular opacity of peripheral left lower lobe.  F/U CT recommended in 6 months.      Essential hypertension 04/24/2011    History of tobacco use, quit in  2012 04/24/2011    History of alcoholic cardiomyopathy      EF  48%  per stress echo 02/29/00.    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  Normal study.      Anxiety     Chest pain 09/24/2006        a. Stress Echo 02/29/00 with mild resting global LV asyneresis. Induced inf and post wall myocardial ischemia      b. 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      c.  Cardiac cath 2/08 - Normal coronaries, normal LV function        Hyperlipidemia          Review of Systems   Constitutional: Negative.   HENT: Negative.     Eyes: Negative.    Cardiovascular:  Positive for dyspnea on exertion and palpitations.   Respiratory: Negative.     Endocrine: Negative.    Hematologic/Lymphatic: Negative.    Skin: Negative.    Musculoskeletal: Negative.    Gastrointestinal: Negative.    Genitourinary: Negative.    Neurological:  Positive for dizziness, headaches and light-headedness.   Psychiatric/Behavioral: Negative.     Allergic/Immunologic: Negative.        Physical Exam    Physical Exam   General Appearance: no distress   Skin: warm, no ulcers or xanthomas   Digits and Nails: no cyanosis or clubbing   Eyes: conjunctivae and lids normal, pupils are equal and round   Teeth/Gums/Palate: dentition unremarkable, no lesions   Lips & Oral Mucosa: no pallor or cyanosis   Neck Veins: normal JVP , neck veins are not distended   Thyroid: no nodules, masses, tenderness or enlargement   Chest Inspection: chest is normal in appearance   Respiratory Effort: breathing comfortably, no respiratory distress   Auscultation/Percussion: lungs clear to auscultation, no rales or rhonchi, no wheezing   PMI: PMI not enlarged or displaced   Cardiac Rhythm: irregular rhythm and fast rate   Cardiac Auscultation: S1, S2 normal, no rub, no gallop   Murmurs: no murmur   Peripheral Circulation: normal peripheral circulation   Carotid Arteries: normal carotid upstroke bilaterally, no bruits   Radial Arteries: normal symmetric radial pulses   Abdominal Aorta: no abdominal aortic bruit   Pedal Pulses: normal symmetric pedal pulses   Lower Extremity Edema: no lower extremity edema   Abdominal Exam: soft, non-tender, no masses, bowel sounds normal   Liver & Spleen: no organomegaly   Gait & Station: walks without assistance   Muscle Strength: normal muscle tone   Orientation: oriented to time, place and person   Affect & Mood: appropriate and sustained affect   Language and Memory: patient responsive and seems to comprehend information   Neurologic Exam: neurological assessment grossly intact   Other: moves all extremities      Cardiovascular Studies    EKG:  AF, rate 124.    Cardiovascular Health Factors  Vitals BP Readings from Last 3 Encounters:   04/04/24 (!) 149/115   02/01/24 135/90   09/28/23 (!) 131/97     Wt Readings from Last 3 Encounters:   04/04/24 92.8 kg (204 lb 9.6 oz)   02/01/24 92.9 kg (204 lb 11.2 oz)   09/28/23 93 kg (205 lb)     BMI Readings from Last 3 Encounters:   04/04/24 26.99 kg/m?   02/01/24 27.01  kg/m?   09/28/23 27.05 kg/m?      Smoking Tobacco Use History[1]   Lipid Profile Cholesterol   Date Value Ref Range Status   02/01/2024 113 <200 mg/dL Final     HDL   Date Value Ref Range Status   02/01/2024 39 (L) >40 mg/dL Final     LDL   Date Value Ref Range Status   02/01/2024 65 <100 mg/dL Final     Triglycerides   Date Value Ref Range Status   02/01/2024 76 <150 mg/dL Final      Blood Sugar Hemoglobin A1C   Date Value Ref Range Status   07/24/2016 6.0 4.0 - 6.0 % Final     Comment:     The ADA recommends that most patients with type 1 and type 2 diabetes maintain   an A1c level <7%.       Glucose   Date Value Ref Range Status   02/01/2024 81 70 - 100 mg/dL Final   91/91/7975 99  Final   02/01/2023 87 70 - 100 MG/DL Final     Glucose, POC   Date Value Ref Range Status   07/11/2018 77 70 - 100 MG/DL Final   88/74/7980 65 (L) 70 - 100 MG/DL Final          Problems Addressed Today  Encounter Diagnoses   Name Primary?    Paroxysmal atrial fibrillation (CMS-HCC) Yes    Pure hypercholesterolemia     Essential hypertension     History of alcoholic cardiomyopathy     Cardiovascular symptoms     Atrial fibrillation, unspecified type (CMS-HCC)     Chest pain, unspecified type     Anxiety        Assessment and Plan       Paroxysmal atrial fibrillation (CMS-HCC)  Tikosyn  500 mcg BID--started in 2017 after most recent cardioversion  Xarelto  20/d    Andruw doesn't want to go to [place] and I have the sense that if I make him go all the way down to the [place] [facility] ED his heart rate and BP will be pretty normal by the time he gets there.  He's just terribly stressed out right now and doesn't handle it well at all.    We'll send a 3-day Holter out that he should get tomorrow.  I need to know whether his AF is paroxysmal or persistent and it's really hard to tell from what he gets on his BP monitor.    Hyperlipidemia  Lab Results   Component Value Date    CHOL 113 02/01/2024    TRIG 76 02/01/2024    HDL 39 (L) 02/01/2024    LDL 65 02/01/2024    VLDL 84.7 02/01/2024    NONHDLCHOL 74 02/01/2024    CHOLHDLC 4.0 11/19/2017      Atorva 40/day.    Essential hypertension  Metoprolol  XL 100/day.  He's been on more for BP in the past, but developed symptomatic hypotension.  I'm afraid that if I augment anti-hypertensive meds now I'll make his BP too low when he's relaxed.  He'll continue to check BP at home and we'll bring him back in 2 days for another BP check here in the clinic.    History of alcoholic cardiomyopathy  Last echo was in 2019, EF 55%.  He's not completely abstinent, but consumes much less alcohol than he did 25 years ago.    Current Medications (including today's revisions)   acetaminophen  SR(+) (TYLENOL ) 650 mg tablet Take one  tablet by mouth every 6 hours as needed for Pain.    albuterol  (VENTOLIN  HFA, PROAIR  HFA, PROVENTIL  HFA) 90 mcg/actuation inhaler Inhale two puffs by mouth into the lungs every 6 hours as needed for Wheezing or Shortness of Breath. Shake well before use.    ALPRAZolam  (XANAX ) 0.5 mg tablet Take one tablet by mouth three times daily as needed.    atorvastatin  (LIPITOR) 40 mg tablet Take 1 tablet by mouth once daily    dofetilide  (TIKOSYN ) 500 mcg capsule Take 1 capsule by mouth twice daily    ipratropium-albuterol  (COMBIVENT  RESPIMAT) 20-100 mcg/actuation mist for inhalation INHALE 1 PUFF BY MOUTH 4 TIMES DAILY    L.ACID/L.CASEI/B.BIF/B.LON/FOS (PROBIOTIC BLEND PO) Take 1 tablet by mouth every 7 days.    Magnesium  Oxide 500 mg tab Take one tablet by mouth twice daily.    metoprolol  succinate XL (TOPROL  XL) 100 mg extended release tablet Take one tablet by mouth daily.    omeprazole DR (PRILOSEC) 20 mg capsule Take one capsule by mouth daily.    rivaroxaban  (XARELTO ) 20 mg tablet Take one tablet by mouth daily with dinner.    SYMBICORT  160-4.5 mcg/actuation aerosol inhaler Inhale 2 puffs by mouth twice daily     Total time spent on today's office visit was 45 minutes.  This includes face-to-face in person visit with patient as well as nonface-to-face time including review of the EMR, outside records, labs, radiologic studies, echocardiogram & other cardiovascular studies, formation of treatment plan, after visit summary, future disposition, and lastly on documentation.              [1]   Social History  Tobacco Use   Smoking Status Former    Current packs/day: 0.00    Average packs/day: 0.5 packs/day for 30.0 years (15.0 ttl pk-yrs)    Types: Cigarettes    Start date: 12/05/1980    Quit date: 12/06/2010    Years since quitting: 13.3   Smokeless Tobacco Never   Tobacco Comments    cessation counseling given 07/15/10

## 2024-04-05 ENCOUNTER — Encounter: Admit: 2024-04-05 | Discharge: 2024-04-05 | Payer: MEDICARE

## 2024-04-06 ENCOUNTER — Encounter: Admit: 2024-04-06 | Discharge: 2024-04-06 | Payer: MEDICARE

## 2024-04-06 DIAGNOSIS — I10 Essential (primary) hypertension: Secondary | ICD-10-CM

## 2024-04-06 DIAGNOSIS — Z8679 Personal history of other diseases of the circulatory system: Principal | ICD-10-CM

## 2024-04-06 DIAGNOSIS — I480 Paroxysmal atrial fibrillation: Secondary | ICD-10-CM

## 2024-04-06 NOTE — Progress Notes
 Patient came to [facility] office today for an EKG after his office visit with Dr. Quin on 8/19 revealed a fib RVR in the 120s. Patient states that he is much feeling better today and has no new complaints at this time.     Blood pressure 130/82  EKG reviewed with Dr. Debroah in clinic. Patient is back in SR 65bpm.

## 2024-04-24 ENCOUNTER — Encounter: Admit: 2024-04-24 | Discharge: 2024-04-24 | Payer: MEDICARE

## 2024-04-24 NOTE — Telephone Encounter
 Spoke with pt (2 ID verified and repeated) regarding Dr. Velton advice as noted below. Pt states took first dose of Keflex  he has at home at 0900- will take  2nd dose at 1 PM. Pt states does not go to the hospital or UC in [place]. Would like to be seen by Dr. Gene at 4 PM.    Called pt back letting pt know will need to come in sooner due to staffing issues. Pt is scheduled at 2:30 as a double book- understands may have to wait long so will come in at 3:20-3:30  [name], RN

## 2024-04-24 NOTE — Telephone Encounter
 LVM @ [id number] stating he thinks he pulled a tendon in his left leg. Had cellulitis in this leg in the past. States the leg is red.

## 2024-04-24 NOTE — Telephone Encounter
 PC to pt (2 pt ID verified and repeated) regarding scheduling conflict. Pt started Keflex  500 mg today- to take 4 times per day. Pt was not at home when I called. States not sure how many caps left but has enough to take until pt is seen on Wednesday. Advised pt if leg is worse or not responding to abx to go to UC before Wednesday. Pt verbalized understanding. Pt will mark the perimeter of the redness to keep track.    Forwarding message to Dr. Gene as an RICK.   [name], RN

## 2024-04-24 NOTE — Telephone Encounter
 Would take the keflex  500 mg po qid -the rx he has at home may only be for 21 tabs - have him check

## 2024-04-24 NOTE — Telephone Encounter
 Reason for Conversation  Leg Pain ([facility] )    Background   Pt states that on 9/3 he was working in the yard when he felt a pop from the back of his knee to the back of his ankle and has been having pain since. Pt reports the pain has been slowly getting better but he is still experiencing severe pain when ambulating, especially when standing on toes. Pt also states that he has redness in his right leg. Pt had severe cellulitis in this leg in the past that required hospitalization, states that he as always had some redness in this peg but he feels like he noticed the redness spreading up his leg today. Pt reports the new redness as red splotches. Per protocol, pt to be seen for immediate office eval. No appts available, will route to PCP for management. Per chart review, pt to take abx when redness worsens and pt states he has a new bottle of cephalexin . Pt advised that he should start taking these and if the redness/pain continues to get worse he needs to be evaluated in [facility]. Pt verbalizes understanding. Pt given care advice/callback precautions.     Disposition   Immediate office evaluation    Reason for Disposition      SEVERE pain (e.g., excruciating, unable to do any normal activities)    1. ONSET: When did the pain start?       Felt like he popped his achilles tendon 9/3, has been icing it and the pain is improved   2. LOCATION: Where is the pain located?       In between the back of knee and back of ankle. Standing on tip toes makes it worse   3. PAIN: How bad is the pain?    (Scale 1-10; or mild, moderate, severe)      6/10  4. WORK OR EXERCISE: Has there been any recent work or exercise that involved this part of the body?       Pt states he was working in the yard and felt his tendon popped and has had pain since   5. CAUSE: What do you think is causing the leg pain?      Pt believes he popped his tendon   6. OTHER SYMPTOMS: Do you have any other symptoms? (e.g., chest pain, back pain, breathing difficulty, swelling, rash, fever, numbness, weakness)      Redness on LLE. He has had chronic redness but pt states today it is spreading up his leg   7. PREGNANCY: Is there any chance you are pregnant? When was your last menstrual period?      NA    No Additional Information on file.    Protocols Used     Leg Pain-[facility]

## 2024-04-24 NOTE — Telephone Encounter
 Needs to be seen- would go to urgent care- could go in [place]- or I could see around 4 pm today

## 2024-04-26 ENCOUNTER — Encounter: Admit: 2024-04-26 | Discharge: 2024-04-26 | Payer: MEDICARE

## 2024-04-26 ENCOUNTER — Ambulatory Visit: Admit: 2024-04-26 | Discharge: 2024-04-27 | Payer: MEDICARE

## 2024-04-26 VITALS — BP 131/89 | HR 85 | Temp 97.70000°F | Ht 73.0 in | Wt 201.0 lb

## 2024-04-26 DIAGNOSIS — I10 Essential (primary) hypertension: Secondary | ICD-10-CM

## 2024-04-26 DIAGNOSIS — R1033 Periumbilical pain: Secondary | ICD-10-CM

## 2024-04-26 DIAGNOSIS — I4891 Unspecified atrial fibrillation: Secondary | ICD-10-CM

## 2024-04-26 DIAGNOSIS — M79662 Pain in left lower leg: Secondary | ICD-10-CM

## 2024-04-26 DIAGNOSIS — L03116 Cellulitis of left lower limb: Principal | ICD-10-CM

## 2024-04-26 MED ORDER — CEPHALEXIN 500 MG PO CAP
500 mg | ORAL_CAPSULE | Freq: Four times a day (QID) | ORAL | 0 refills | 7.00000 days | Status: DC
Start: 2024-04-26 — End: 2024-04-26

## 2024-04-26 MED ORDER — CEPHALEXIN 500 MG PO CAP
500 mg | ORAL_CAPSULE | Freq: Four times a day (QID) | ORAL | 0 refills | 7.00000 days | Status: AC
Start: 2024-04-26 — End: ?

## 2024-04-26 NOTE — Patient Instructions
 Radiology [phone]

## 2024-04-26 NOTE — Progress Notes
 Date of Service: 04/26/2024    Jonathan Barr is a 68 y.o. male. DOB: 08/03/1956   MRN#: [id number]    Subjective:       68 yo male injured his left calf x 2 while mowing- the 1st injury was 1-2 months ago-injured it again on 04-20-24  His left leg started to get red on 9-6 or 04-23-24  Started keflex  on 04-24-24 - here for f/u- the redness is better   Reviewed hx in epic briefly  Hx of afib- htn- hyperlipidemia- COPD                 Review of Systems   Constitutional: Positive for chills and diaphoresis. Negative for fever.   Cardiovascular:         Is on xarelto    Musculoskeletal:         Pt been icing his calf and elevating his leg   Takes tylenol  for pain   Gastrointestinal:  Positive for abdominal pain, diarrhea and nausea. Negative for hematochezia, melena and vomiting.        C/o mid abd pain x 6 months or more   Eating sometimes helps   Worse with mowing grass   Genitourinary:  Positive for dysuria. Negative for hematuria.        Occ dysuria          Objective:      acetaminophen  SR(+) (TYLENOL ) 650 mg tablet Take one tablet by mouth every 6 hours as needed for Pain.    albuterol  (VENTOLIN  HFA, PROAIR  HFA, PROVENTIL  HFA) 90 mcg/actuation inhaler Inhale two puffs by mouth into the lungs every 6 hours as needed for Wheezing or Shortness of Breath. Shake well before use.    ALPRAZolam  (XANAX ) 0.5 mg tablet Take one tablet by mouth three times daily as needed.    atorvastatin  (LIPITOR) 40 mg tablet Take 1 tablet by mouth once daily    dofetilide  (TIKOSYN ) 500 mcg capsule Take 1 capsule by mouth twice daily    ipratropium-albuterol  (COMBIVENT  RESPIMAT) 20-100 mcg/actuation mist for inhalation INHALE 1 PUFF BY MOUTH 4 TIMES DAILY    L.ACID/L.CASEI/B.BIF/B.LON/FOS (PROBIOTIC BLEND PO) Take 1 tablet by mouth every 7 days.    Magnesium  Oxide 500 mg tab Take one tablet by mouth twice daily.    metoprolol  succinate XL (TOPROL  XL) 100 mg extended release tablet Take one tablet by mouth daily.    omeprazole DR (PRILOSEC) 20 mg capsule Take one capsule by mouth daily.    rivaroxaban  (XARELTO ) 20 mg tablet Take one tablet by mouth daily with dinner.    SYMBICORT  160-4.5 mcg/actuation aerosol inhaler Inhale 2 puffs by mouth twice daily     Vitals:    04/26/24 1311   BP: 131/89   BP Source: Arm, Right Upper   Pulse: 85   Temp: 36.5 ?C (97.7 ?F)   SpO2: 97%   TempSrc: Oral   PainSc: Six   Weight: 91.2 kg (201 lb)   Height: 185.4 cm (6' 1)     Body mass index is 26.52 kg/m?Jonathan Barr     Physical Exam  Vitals and nursing note reviewed.   Constitutional:       General: He is not in acute distress.     Appearance: Normal appearance. He is well-developed. He is not ill-appearing.      Comments: Bp was 110-120/70-80 on recheck   HENT:      Head: Normocephalic and atraumatic.      Right Ear: Tympanic membrane and ear  canal normal.      Left Ear: Tympanic membrane and ear canal normal.      Mouth/Throat:      Mouth: Mucous membranes are moist.      Pharynx: Oropharynx is clear.   Eyes:      Conjunctiva/sclera: Conjunctivae normal.      Pupils: Pupils are equal, round, and reactive to light.   Neck:      Trachea: No tracheal deviation.   Cardiovascular:      Rate and Rhythm: Normal rate. Rhythm irregular.      Heart sounds: No murmur heard.  Pulmonary:      Effort: Pulmonary effort is normal.      Breath sounds: Normal breath sounds.   Abdominal:      General: Bowel sounds are normal. There is no distension.      Palpations: Abdomen is soft. There is no mass.      Comments: Pt points to his supraumbilical area as being the area of discomfort   Musculoskeletal:      Cervical back: Neck supple.      Comments: His left calf looks a little larger than his rt   Has pain over his left calf when he bears wt    Lymphadenopathy:      Cervical: No cervical adenopathy.   Skin:     General: Skin is warm and dry.      Comments: His lt lower leg is more pink than his rt one- pt outlined the pink area previously - is smaller today   Neurological:      General: No focal deficit present.      Mental Status: He is alert and oriented to person, place, and time.      Cranial Nerves: No cranial nerve deficit.   Psychiatric:         Mood and Affect: Mood normal.         Behavior: Behavior normal.         Thought Content: Thought content normal.         Judgment: Judgment normal.         A/p  LLE cellulitis- pink area is smaller than it was and is faint- I think the keflex  has been effective- started it 2 days ago- he only had enough caps for 5 days- ordered another 10 days- should take for another week- then save the left over caps if looks like the cellulitis has resolved- if recurs - pt will let us  know- then will resume the left over keflex  caps he has at home and will send more to his pharmacy  Lt calf pain- is on xarelto - missed 1 dose only recently - doubt DVT - think he probably has a gastroc tear secondary to 2 mowing injuries  Mid abd pain- had lab in June- pain has been present for about 6 months -ordered an abd sono to start with -abd mass? Recurrent hernia? consider ordering a CT abd and pelvis if the sono is unrevealing  Afib- cont xarelto - tikosyn  and toprol  xl  Htn- bp was 110-120/70-80 on recheck- under control  Declined a flu shot today- asked him to get one this fall at his pharmacy  Rtc prn

## 2024-04-27 ENCOUNTER — Encounter: Admit: 2024-04-27 | Discharge: 2024-04-27 | Payer: MEDICARE

## 2024-04-27 NOTE — Progress Notes
 Referral printed and faxed along with patients demographics, insurance cards, and clinic notes.  Faxed to [facility]. Joe @ [phone].

## 2024-05-08 ENCOUNTER — Encounter: Admit: 2024-05-08 | Discharge: 2024-05-08 | Payer: MEDICARE

## 2024-05-10 ENCOUNTER — Encounter: Admit: 2024-05-10 | Discharge: 2024-05-10 | Payer: MEDICARE

## 2024-05-10 DIAGNOSIS — R1033 Periumbilical pain: Principal | ICD-10-CM

## 2024-05-11 ENCOUNTER — Encounter: Admit: 2024-05-11 | Discharge: 2024-05-11 | Payer: MEDICARE

## 2024-05-28 ENCOUNTER — Encounter: Admit: 2024-05-28 | Discharge: 2024-05-28 | Payer: MEDICARE

## 2024-05-28 MED ORDER — SYMBICORT 160-4.5 MCG/ACTUATION IN HFAA
2 | Freq: Two times a day (BID) | RESPIRATORY_TRACT | 3 refills | 30.00000 days | Status: AC
Start: 2024-05-28 — End: ?

## 2024-07-02 ENCOUNTER — Encounter: Admit: 2024-07-02 | Discharge: 2024-07-02 | Payer: MEDICARE

## 2024-08-24 ENCOUNTER — Encounter: Admit: 2024-08-24 | Discharge: 2024-08-24 | Payer: MEDICARE

## 2024-08-24 DIAGNOSIS — J449 Chronic obstructive pulmonary disease, unspecified: Secondary | ICD-10-CM

## 2024-08-24 DIAGNOSIS — I10 Essential (primary) hypertension: Secondary | ICD-10-CM

## 2024-08-24 DIAGNOSIS — K219 Gastro-esophageal reflux disease without esophagitis: Secondary | ICD-10-CM

## 2024-08-24 DIAGNOSIS — I4819 Other persistent atrial fibrillation: Secondary | ICD-10-CM

## 2024-08-24 DIAGNOSIS — E7800 Pure hypercholesterolemia, unspecified: Principal | ICD-10-CM

## 2024-08-24 DIAGNOSIS — I513 Intracardiac thrombosis, not elsewhere classified: Secondary | ICD-10-CM

## 2024-08-24 DIAGNOSIS — I5022 Chronic systolic (congestive) heart failure: Secondary | ICD-10-CM

## 2024-08-24 DIAGNOSIS — G4734 Idiopathic sleep related nonobstructive alveolar hypoventilation: Secondary | ICD-10-CM

## 2024-08-24 MED ORDER — DOFETILIDE 500 MCG PO CAP
500 ug | ORAL_CAPSULE | Freq: Two times a day (BID) | ORAL | 0 refills | 30.00000 days | Status: AC
Start: 2024-08-24 — End: ?

## 2024-08-24 NOTE — Telephone Encounter [36]
 08/24/2024 8:18 AM     Med refill off protocol.  Pt is due for office visit 2/26, not yet scheduled.  Nurse sent pt my chart message asking to call scheduling to schedule follow up evaluation with Dr. Quin now.

## 2024-09-01 ENCOUNTER — Encounter: Admit: 2024-09-01 | Discharge: 2024-09-01 | Payer: MEDICARE
